# Patient Record
Sex: Female | Born: 1965 | Race: White | Hispanic: No | Marital: Single | Smoking: Former smoker
Health system: Southern US, Community
[De-identification: ages and names within clinical notes are randomized; demographics above are authoritative.]

## PROBLEM LIST (undated history)

## (undated) DIAGNOSIS — I499 Cardiac arrhythmia, unspecified: Secondary | ICD-10-CM

## (undated) DIAGNOSIS — K219 Gastro-esophageal reflux disease without esophagitis: Secondary | ICD-10-CM

## (undated) DIAGNOSIS — I10 Essential (primary) hypertension: Secondary | ICD-10-CM

## (undated) DIAGNOSIS — G4733 Obstructive sleep apnea (adult) (pediatric): Secondary | ICD-10-CM

## (undated) DIAGNOSIS — J302 Other seasonal allergic rhinitis: Secondary | ICD-10-CM

## (undated) DIAGNOSIS — E785 Hyperlipidemia, unspecified: Secondary | ICD-10-CM

## (undated) HISTORY — DX: Hyperlipidemia, unspecified: E78.5

## (undated) HISTORY — PX: PATELLA RECONSTRUCTION: SHX736

## (undated) HISTORY — PX: TONSILLECTOMY: SUR1361

## (undated) HISTORY — PX: DEBRIDEMENT TENNIS ELBOW: SHX1442

## (undated) HISTORY — PX: BLADDER SUSPENSION: SHX72

## (undated) HISTORY — DX: Obstructive sleep apnea (adult) (pediatric): G47.33

## (undated) HISTORY — PX: ADENOIDECTOMY: SUR15

## (undated) HISTORY — DX: Essential (primary) hypertension: I10

## (undated) HISTORY — PX: ELBOW SURGERY: SHX618

## (undated) HISTORY — DX: Cardiac arrhythmia, unspecified: I49.9

---

## 2004-02-09 ENCOUNTER — Ambulatory Visit: Payer: Self-pay | Admitting: Internal Medicine

## 2004-03-22 ENCOUNTER — Ambulatory Visit: Payer: Self-pay | Admitting: Internal Medicine

## 2004-10-23 ENCOUNTER — Ambulatory Visit: Payer: Self-pay | Admitting: Internal Medicine

## 2005-09-06 ENCOUNTER — Ambulatory Visit: Payer: Self-pay | Admitting: Internal Medicine

## 2006-08-15 ENCOUNTER — Telehealth: Payer: Self-pay | Admitting: Internal Medicine

## 2006-08-15 ENCOUNTER — Ambulatory Visit: Payer: Self-pay | Admitting: Internal Medicine

## 2006-08-15 DIAGNOSIS — J309 Allergic rhinitis, unspecified: Secondary | ICD-10-CM | POA: Insufficient documentation

## 2006-08-15 DIAGNOSIS — I10 Essential (primary) hypertension: Secondary | ICD-10-CM

## 2006-08-15 LAB — CONVERTED CEMR LAB
ALT: 17 units/L (ref 0–35)
Albumin: 4 g/dL (ref 3.5–5.2)
Basophils Absolute: 0.1 10*3/uL (ref 0.0–0.1)
Bilirubin Urine: NEGATIVE
CO2: 27 meq/L (ref 19–32)
Cholesterol: 186 mg/dL (ref 0–200)
Creatinine, Ser: 0.5 mg/dL (ref 0.4–1.2)
Eosinophils Absolute: 0.4 10*3/uL (ref 0.0–0.6)
GFR calc non Af Amer: 144 mL/min
Glucose, Urine, Semiquant: NEGATIVE
HCT: 39.2 % (ref 36.0–46.0)
HDL: 29.7 mg/dL — ABNORMAL LOW (ref >39.0)
LDL Cholesterol: 139 mg/dL — ABNORMAL HIGH (ref 0–99)
MCHC: 34.8 g/dL (ref 30.0–36.0)
MCV: 90 fL (ref 78.0–100.0)
Monocytes Relative: 7.4 % (ref 3.0–11.0)
Neutrophils Relative %: 62 % (ref 43.0–77.0)
Platelets: 274 10*3/uL (ref 150–400)
Potassium: 4 meq/L (ref 3.5–5.1)
RDW: 12.8 % (ref 11.5–14.6)
Sodium: 137 meq/L (ref 135–145)
Specific Gravity, Urine: 1.02
TSH: 3.13 microintl units/mL (ref 0.35–5.50)
Total CHOL/HDL Ratio: 6.3

## 2006-08-21 ENCOUNTER — Ambulatory Visit: Payer: Self-pay | Admitting: Internal Medicine

## 2006-08-21 DIAGNOSIS — K219 Gastro-esophageal reflux disease without esophagitis: Secondary | ICD-10-CM | POA: Insufficient documentation

## 2006-09-13 ENCOUNTER — Telehealth: Payer: Self-pay | Admitting: *Deleted

## 2007-02-28 ENCOUNTER — Ambulatory Visit: Payer: Self-pay | Admitting: Internal Medicine

## 2007-02-28 DIAGNOSIS — M549 Dorsalgia, unspecified: Secondary | ICD-10-CM | POA: Insufficient documentation

## 2007-09-24 ENCOUNTER — Ambulatory Visit: Payer: Self-pay | Admitting: Internal Medicine

## 2007-12-22 ENCOUNTER — Encounter: Admission: RE | Admit: 2007-12-22 | Discharge: 2007-12-22 | Payer: Self-pay | Admitting: Obstetrics and Gynecology

## 2008-03-24 ENCOUNTER — Ambulatory Visit: Payer: Self-pay | Admitting: Internal Medicine

## 2008-08-25 ENCOUNTER — Ambulatory Visit: Payer: Self-pay | Admitting: Internal Medicine

## 2008-08-25 ENCOUNTER — Telehealth: Payer: Self-pay | Admitting: Internal Medicine

## 2008-09-14 ENCOUNTER — Ambulatory Visit: Payer: Self-pay | Admitting: Internal Medicine

## 2008-10-28 ENCOUNTER — Telehealth: Payer: Self-pay | Admitting: Internal Medicine

## 2009-04-28 ENCOUNTER — Encounter: Payer: Self-pay | Admitting: Internal Medicine

## 2009-05-16 ENCOUNTER — Ambulatory Visit: Payer: Self-pay | Admitting: Occupational Medicine

## 2009-05-16 ENCOUNTER — Encounter: Admission: RE | Admit: 2009-05-16 | Discharge: 2009-05-16 | Payer: Self-pay | Admitting: Obstetrics and Gynecology

## 2009-05-26 ENCOUNTER — Encounter: Payer: Self-pay | Admitting: Internal Medicine

## 2009-05-26 DIAGNOSIS — I499 Cardiac arrhythmia, unspecified: Secondary | ICD-10-CM

## 2009-05-26 HISTORY — DX: Cardiac arrhythmia, unspecified: I49.9

## 2009-06-03 ENCOUNTER — Encounter: Payer: Self-pay | Admitting: Internal Medicine

## 2009-06-08 ENCOUNTER — Encounter: Payer: Self-pay | Admitting: Internal Medicine

## 2009-07-01 ENCOUNTER — Encounter: Payer: Self-pay | Admitting: Internal Medicine

## 2009-09-26 ENCOUNTER — Telehealth: Payer: Self-pay | Admitting: Internal Medicine

## 2009-11-24 ENCOUNTER — Encounter: Payer: Self-pay | Admitting: Internal Medicine

## 2010-01-29 ENCOUNTER — Encounter: Payer: Self-pay | Admitting: Internal Medicine

## 2010-02-07 NOTE — Letter (Signed)
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  Please take this into consideration when reviewing the time away from work/school.    Special Instructions:  [  ] None  [  X] To be off the remainder of today, returning to the normal work / school schedule tomorrow.  [  ] To be off until the next scheduled appointment on ______________________.  [  ] Other ________________________________________________________________ ________________________________________________________________________   Sincerely yours,   Lucia Gaskins MD

## 2010-02-07 NOTE — Letter (Signed)
Summary: [facility]  [facility]   Imported By: Maryln Gottron 06/21/2009 12:43:23  _____________________________________________________________________  External Attachment:    Type:   Image     Comment:   External Document

## 2010-02-07 NOTE — Letter (Signed)
Summary: [facility]  [facility]   Imported By: Maryln Gottron 05/09/2009 13:05:03  _____________________________________________________________________  External Attachment:    Type:   Image     Comment:   External Document
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
## 2010-02-07 NOTE — Letter (Signed)
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  Please take this into consideration when reviewing the time away from work/school.    Special Instructions:  [  ] None  [  ] To be off the remainder of today, returning to the normal work / school schedule tomorrow.  [  ] To be off until the next scheduled appointment on ______________________.  [  ] Other ________________________________________________________________ ________________________________________________________________________   Sincerely yours,   Lucia Gaskins MD

## 2010-02-07 NOTE — Progress Notes (Signed)
Summary: refill pain med - denied  Phone Note Refill Request Message from:  Fax from Pharmacy on September 26, 2009 1:43 PM  Refills Requested: Medication #1:  HYDROCODONE-ACETAMINOPHEN 5-500 MG  TABS one every 6 hours for pain   Last Refilled: 06/04/2009 [facility] [address]    Method Requested: Fax to Local Pharmacy Initial call taken by: Duard Brady LPN,  September 26, 2009 1:44 PM  Follow-up for Phone Call        denied - faxed to [facility] - last seen 09/2008 - needs to be seen . KIK Follow-up by: Duard Brady LPN,  September 26, 2009 1:45 PM

## 2010-02-07 NOTE — Letter (Signed)
Summary: [facility]  [facility]   Imported By: Maryln Gottron 07/14/2009 13:53:38  _____________________________________________________________________  External Attachment:    Type:   Image     Comment:   External Document

## 2010-02-07 NOTE — Letter (Signed)
Summary: [facility]   Imported By: Maryln Gottron 08/09/2009 09:58:01  _____________________________________________________________________  External Attachment:    Type:   Image     Comment:   External Document

## 2010-02-09 NOTE — Letter (Signed)
Summary: [facility]  [facility]   Imported By: Maryln Gottron 01/05/2010 15:01:07  _____________________________________________________________________  External Attachment:    Type:   Image     Comment:   External Document

## 2010-03-02 ENCOUNTER — Inpatient Hospital Stay (INDEPENDENT_AMBULATORY_CARE_PROVIDER_SITE_OTHER)
Admission: RE | Admit: 2010-03-02 | Discharge: 2010-03-02 | Disposition: A | Discharge status: Home / Self Care | Payer: BC Managed Care – PPO | Source: Ambulatory Visit | Attending: Family Medicine | Admitting: Family Medicine

## 2010-03-02 DIAGNOSIS — J019 Acute sinusitis, unspecified: Secondary | ICD-10-CM

## 2011-11-20 ENCOUNTER — Other Ambulatory Visit: Payer: Self-pay | Admitting: Obstetrics and Gynecology

## 2011-11-20 DIAGNOSIS — Z1231 Encounter for screening mammogram for malignant neoplasm of breast: Secondary | ICD-10-CM

## 2011-12-11 ENCOUNTER — Ambulatory Visit (INDEPENDENT_AMBULATORY_CARE_PROVIDER_SITE_OTHER): Payer: BC Managed Care – PPO

## 2011-12-11 DIAGNOSIS — Z1231 Encounter for screening mammogram for malignant neoplasm of breast: Secondary | ICD-10-CM

## 2012-02-11 ENCOUNTER — Encounter: Payer: Self-pay | Admitting: *Deleted

## 2012-02-11 NOTE — Telephone Encounter (Signed)
Opened in error

## 2012-05-06 ENCOUNTER — Ambulatory Visit (INDEPENDENT_AMBULATORY_CARE_PROVIDER_SITE_OTHER): Payer: BC Managed Care – PPO | Admitting: Internal Medicine

## 2012-05-06 ENCOUNTER — Encounter: Payer: Self-pay | Admitting: Internal Medicine

## 2012-05-06 VITALS — BP 180/100 | HR 116 | Temp 97.8°F | Resp 20 | Ht 66.0 in | Wt 215.0 lb

## 2012-05-06 DIAGNOSIS — K219 Gastro-esophageal reflux disease without esophagitis: Secondary | ICD-10-CM

## 2012-05-06 DIAGNOSIS — Z0000 Encounter for general adult medical examination without abnormal findings: Secondary | ICD-10-CM

## 2012-05-06 DIAGNOSIS — M549 Dorsalgia, unspecified: Secondary | ICD-10-CM

## 2012-05-06 DIAGNOSIS — I10 Essential (primary) hypertension: Secondary | ICD-10-CM

## 2012-05-06 DIAGNOSIS — J309 Allergic rhinitis, unspecified: Secondary | ICD-10-CM

## 2012-05-06 MED ORDER — BENAZEPRIL-HYDROCHLOROTHIAZIDE 20-12.5 MG PO TABS: 1.0000 | ORAL_TABLET | Freq: Every day | ORAL | Status: DC

## 2012-05-06 MED ORDER — PANTOPRAZOLE SODIUM 40 MG PO TBEC: 40.0000 mg | DELAYED_RELEASE_TABLET | Freq: Every day | ORAL | Status: DC

## 2012-05-06 NOTE — Patient Instructions (Signed)

## 2012-05-06 NOTE — Progress Notes (Signed)
Subjective:    Patient ID: Jessica Glass, female    DOB: 1965-10-12, 47 y.o.   MRN: [id number]  HPI  47 year old patient who is seen today to reestablish with our practice. She has not been seen in this office in a number of years. She has long history of treated hypertension which has been controlled on combination therapy. Medical problems include gastroesophageal reflux disease allergic rhinitis. She has a history of migraine headaches low back pain and urinary incontinence. Surgical procedures have included a remote tonsillectomy bladder sling procedure in 2014 and knee surgery in 1983.  Patient has been followed by [facility] and has had recent laboratory studies including a lipid panel. He is scheduled to see Dr. Allyson Sabal in the near future. She has a history of exercise associated tachycardia  Family history father age 21 history of hypertension status post cholecystectomy Mother died age 44 of ovarian cancer history of hypertension and diabetes 3 sisters posture hypertension and dyslipidemia One brother is well Tobacco use  History reviewed. No pertinent past medical history.  History   Social History  . Marital Status: Single    Spouse Name: N/A    Number of Children: N/A  . Years of Education: N/A   Occupational History  . Not on file.   Social History Main Topics  . Smoking status: Former Games developer  . Smokeless tobacco: Never Used  . Alcohol Use: No  . Drug Use: No  . Sexually Active: Not on file   Other Topics Concern  . Not on file   Social History Narrative  . No narrative on file    History reviewed. No pertinent past surgical history.  No family history on file.  No Known Allergies  No current outpatient prescriptions on file prior to visit.   No current facility-administered medications on file prior to visit.    BP 180/100  Pulse 116  Temp(Src) 97.8 F (36.6 C) (Oral)  Resp 20  Ht 5\' 6"  (1.676 m)  Wt 215 lb (97.523 kg)  BMI  34.72 kg/m2  SpO2 97%  LMP 04/21/2012      Review of Systems  Constitutional: Negative for fever, appetite change, fatigue and unexpected weight change.  HENT: Negative for hearing loss, ear pain, nosebleeds, congestion, sore throat, mouth sores, trouble swallowing, neck stiffness, dental problem, voice change, sinus pressure and tinnitus.   Eyes: Negative for photophobia, pain, redness and visual disturbance.  Respiratory: Negative for cough, chest tightness and shortness of breath.   Cardiovascular: Negative for chest pain, palpitations and leg swelling.  Gastrointestinal: Negative for nausea, vomiting, abdominal pain, diarrhea, constipation, blood in stool, abdominal distention and rectal pain.  Genitourinary: Negative for dysuria, urgency, frequency, hematuria, flank pain, vaginal bleeding, vaginal discharge, difficulty urinating, genital sores, vaginal pain, menstrual problem and pelvic pain.  Musculoskeletal: Negative for back pain and arthralgias.       Bilateral knee pain Status post bilateral joint injections today by orthopedics  Skin: Negative for rash.  Neurological: Negative for dizziness, syncope, speech difficulty, weakness, light-headedness, numbness and headaches.  Hematological: Negative for adenopathy. Does not bruise/bleed easily.  Psychiatric/Behavioral: Negative for suicidal ideas, behavioral problems, self-injury, dysphoric mood and agitation. The patient is not nervous/anxious.        Objective:   Physical Exam  Constitutional: She is oriented to person, place, and time. She appears well-developed and well-nourished.  Repeat blood pressure 120/80  HENT:  Head: Normocephalic and atraumatic.  Right Ear: External ear normal.  Left Ear:  External ear normal.  Mouth/Throat: Oropharynx is clear and moist.  Eyes: Conjunctivae and EOM are normal.  Neck: Normal range of motion. Neck supple. No JVD present. No thyromegaly present.  Cardiovascular: Normal rate,  regular rhythm, normal heart sounds and intact distal pulses.   No murmur heard. Pull shirt posture 100  Pulmonary/Chest: Effort normal and breath sounds normal. She has no wheezes. She has no rales.  Abdominal: Soft. Bowel sounds are normal. She exhibits no distension and no mass. There is no tenderness. There is no rebound and no guarding.  Genitourinary: Vagina normal.  Musculoskeletal: Normal range of motion. She exhibits no edema and no tenderness.  Neurological: She is alert and oriented to person, place, and time. She has normal reflexes. No cranial nerve deficit. She exhibits normal muscle tone. Coordination normal.  Skin: Skin is warm and dry. No rash noted.  Psychiatric: She has a normal mood and affect. Her behavior is normal.          Assessment & Plan:  Preventive health examination Hypertension Exogenous obesity  Weight loss encouraged We'll continue low-salt diet Exercise regimen recommended Recheck 6 months

## 2012-06-04 ENCOUNTER — Encounter: Payer: Self-pay | Admitting: Cardiovascular Disease

## 2012-06-04 ENCOUNTER — Encounter: Payer: Self-pay | Admitting: *Deleted

## 2012-06-05 ENCOUNTER — Ambulatory Visit (INDEPENDENT_AMBULATORY_CARE_PROVIDER_SITE_OTHER): Payer: BC Managed Care – PPO | Admitting: Cardiovascular Disease

## 2012-06-05 ENCOUNTER — Encounter: Payer: Self-pay | Admitting: Cardiovascular Disease

## 2012-06-05 VITALS — BP 132/92 | HR 82 | Ht 64.75 in | Wt 209.0 lb

## 2012-06-05 DIAGNOSIS — I10 Essential (primary) hypertension: Secondary | ICD-10-CM

## 2012-06-05 DIAGNOSIS — Z79899 Other long term (current) drug therapy: Secondary | ICD-10-CM

## 2012-06-05 DIAGNOSIS — E785 Hyperlipidemia, unspecified: Secondary | ICD-10-CM

## 2012-06-05 DIAGNOSIS — G4733 Obstructive sleep apnea (adult) (pediatric): Secondary | ICD-10-CM

## 2012-06-05 MED ORDER — ATORVASTATIN CALCIUM 20 MG PO TABS: 20.0000 mg | ORAL_TABLET | Freq: Every day | ORAL | Status: DC

## 2012-06-05 NOTE — Patient Instructions (Addendum)
  Your physician wants you to follow-up with him in : 12 months                                                       You will receive a reminder letter in the mail one month in advance. If you don't receive a letter, please call our office to schedule the follow-up appointment.   Your physician recommends that you return for lab work in: 6-8 weeks, fasting   Your physician has recommended you make the following change in your medication: start atorvastatin 20mg  daily for your cholesterol   Dr Allyson Sabal would like for you to get a sleep study at [facility]

## 2012-06-05 NOTE — Assessment & Plan Note (Signed)
Recent lipid profile revealed total cholesterol 205, LDL 139 and HDL of 35. And to begin her on a burst and 20 mg a day and recheck a lipid and liver profile in 2 months. She does admit to dietary indiscretion.

## 2012-06-05 NOTE — Assessment & Plan Note (Addendum)
Borderline compensated on medication

## 2012-06-05 NOTE — Progress Notes (Signed)
06/05/2012 Jessica Glass   [birth year]  [id number]  Primary Physician Rogelia Boga, MD Primary Cardiologist: Runell Gess MD Jessica Glass    HPI:  Jessica Glass is a 47 year old moderately overweight single Caucasian female the children while I saw 11/24/09. She works as a Hospital doctor. Her factors include remote tobacco abuse, hyperlipidemia and hypertension. She denies chest pain or shortness of breath. She did see me for tachypalpitations. A 2-D echo was normal as were thyroid function tests. Her symptoms are somewhat improved over last several years. She also complains of symptoms compatible type of sleep apnea. Recent lab work revealed a total cholesterol 205, LDL of 149 and HDL of 35.   Current Outpatient Prescriptions  Medication Sig Dispense Refill  . benazepril-hydrochlorthiazide (LOTENSIN HCT) 20-12.5 MG per tablet Take 1 tablet by mouth daily.  90 tablet  6  . cetirizine (ZYRTEC) 10 MG tablet Take 10 mg by mouth daily.      . chlorhexidine (PERIDEX) 0.12 % solution Use as directed 15 mLs in the mouth or throat 2 (two) times daily.       . DRYSOL 20 % external solution Apply 1 application topically every other day.      Marland Kitchen EPIPEN 2-PAK 0.3 MG/0.3ML SOAJ Inject 0.3 mg into the skin as needed.      . nitrofurantoin, macrocrystal-monohydrate, (MACROBID) 100 MG capsule Take 2 capsules by mouth every 12 (twelve) hours.      . pantoprazole (PROTONIX) 40 MG tablet Take 1 tablet (40 mg total) by mouth daily.  90 tablet  6  . atorvastatin (LIPITOR) 20 MG tablet Take 1 tablet (20 mg total) by mouth daily.  30 tablet  11   No current facility-administered medications for this visit.    No Known Allergies  History   Social History  . Marital Status: Single    Spouse Name: N/A    Number of Children: N/A  . Years of Education: N/A   Occupational History  . Not on file.   Social History Main Topics  . Smoking status: Former Smoker -- 0.25 packs/day    Types:  Cigarettes    Quit date: 06/06/2007  . Smokeless tobacco: Never Used  . Alcohol Use: No  . Drug Use: No  . Sexually Active: Not on file   Other Topics Concern  . Not on file   Social History Narrative  . No narrative on file     Review of Systems: General: negative for chills, fever, night sweats or weight changes.  Cardiovascular: negative for chest pain, dyspnea on exertion, edema, orthopnea, palpitations, paroxysmal nocturnal dyspnea or shortness of breath Dermatological: negative for rash Respiratory: negative for cough or wheezing Urologic: negative for hematuria Abdominal: negative for nausea, vomiting, diarrhea, bright red blood per rectum, melena, or hematemesis Neurologic: negative for visual changes, syncope, or dizziness All other systems reviewed and are otherwise negative except as noted above.    Blood pressure 132/92, pulse 82, height 5' 4.75" (1.645 m), weight 209 lb (94.802 kg), last menstrual period 04/21/2012.  General appearance: alert and no distress Neck: no adenopathy, no carotid bruit, no JVD, supple, symmetrical, trachea midline and thyroid not enlarged, symmetric, no tenderness/mass/nodules Lungs: clear to auscultation bilaterally Heart: regular rate and rhythm, S1, S2 normal, no murmur, click, rub or gallop Extremities: extremities normal, atraumatic, no cyanosis or edema  EKG sinus rhythm 82 without ST or T wave changes  ASSESSMENT AND PLAN:   Hypertension Borderline compensated on medication  Hyperlipidemia Recent lipid profile  revealed total cholesterol 205, LDL 139 and HDL of 35. And to begin her on a burst and 20 mg a day and recheck a lipid and liver profile in 2 months. She does admit to dietary indiscretion.      Runell Gess MD FACP,FACC,FAHA, [facility] 06/05/2012 9:03 AM

## 2012-06-06 ENCOUNTER — Telehealth: Payer: Self-pay | Admitting: Cardiovascular Disease

## 2012-06-06 MED ORDER — ATORVASTATIN CALCIUM 20 MG PO TABS: 20.0000 mg | ORAL_TABLET | Freq: Every day | ORAL | Status: DC

## 2012-06-06 NOTE — Telephone Encounter (Signed)
Saw Dr Allyson Sabal yesterday here at the office-said he was going to call in her cholesterol medicine-checked with the pharmacist a few minutes ago! Pleas call in to [facility] in [place]

## 2012-06-06 NOTE — Telephone Encounter (Signed)
rx sent to [facility]

## 2012-06-18 ENCOUNTER — Telehealth: Payer: Self-pay | Admitting: Cardiovascular Disease

## 2012-06-18 NOTE — Telephone Encounter (Signed)
Returned call.  Left message to call back before 4pm.  

## 2012-06-18 NOTE — Telephone Encounter (Signed)
Returned call.  Left message to call back tomorrow before 4pm.  

## 2012-06-18 NOTE — Telephone Encounter (Signed)
Returning Amber call

## 2012-06-18 NOTE — Telephone Encounter (Signed)
Wants to know who Dr Allyson Sabal referred her to for sleep study?

## 2012-06-19 ENCOUNTER — Telehealth: Payer: Self-pay | Admitting: *Deleted

## 2012-06-19 NOTE — Telephone Encounter (Signed)
Pt stated she was supposed to be scheduled for a sleep study at the center in [facility].  Stated she called both centers in [facility] and was told they don't have anything for her.  Stated she has to go to [employer] b/c of her job and cannot go to [place].  Stated she know she was given an appt card and thinks the appt was on the 17th (June), but she can' t find the card.  Pt would like the referral, insurance info and records sent to [facility] in [facility] [phone] (phone).  Pt informed Neill Loft, RN is out of the office the rest of the week and another nurse/CMA will be notified.  Pt  Pt verbalized understanding and agreed w/ plan.  Pt may have an appt w/ [facility] and it will need to be cancelled.  Pt would like a call back on her cell and stated it is okay to leave a message on her cell if she doesn't answer.  Paper chart requested.

## 2012-07-07 ENCOUNTER — Encounter: Payer: Self-pay | Admitting: Internal Medicine

## 2012-07-28 ENCOUNTER — Other Ambulatory Visit: Payer: Self-pay | Admitting: *Deleted

## 2012-07-30 ENCOUNTER — Other Ambulatory Visit: Payer: Self-pay | Admitting: *Deleted

## 2012-07-30 MED ORDER — ATORVASTATIN CALCIUM 20 MG PO TABS: 20.0000 mg | ORAL_TABLET | Freq: Every day | ORAL | Status: DC

## 2012-08-04 ENCOUNTER — Telehealth: Payer: Self-pay | Admitting: Cardiovascular Disease

## 2012-08-04 NOTE — Telephone Encounter (Signed)
Returned call.  Left message to call back tomorrow before 4pm.  

## 2012-08-04 NOTE — Telephone Encounter (Signed)
Patient returned your call.

## 2012-08-04 NOTE — Telephone Encounter (Signed)
Waiting for sleep study results and wants to know what is the next step?Jessica Glass is out because of her mother's death-that is why I am sending it to the clinical pool

## 2012-08-05 NOTE — Telephone Encounter (Signed)
Returned call.  Left message to call back before 4pm and she will be contacted once results reviewed with next steps.

## 2012-11-05 ENCOUNTER — Ambulatory Visit (INDEPENDENT_AMBULATORY_CARE_PROVIDER_SITE_OTHER): Payer: BC Managed Care – PPO | Admitting: Internal Medicine

## 2012-11-05 ENCOUNTER — Encounter: Payer: Self-pay | Admitting: Internal Medicine

## 2012-11-05 VITALS — BP 140/90 | HR 89 | Temp 98.1°F | Resp 20 | Wt 206.0 lb

## 2012-11-05 DIAGNOSIS — I10 Essential (primary) hypertension: Secondary | ICD-10-CM

## 2012-11-05 DIAGNOSIS — E785 Hyperlipidemia, unspecified: Secondary | ICD-10-CM

## 2012-11-05 NOTE — Patient Instructions (Signed)
Limit your sodium (Salt) intake    It is important that you exercise regularly, at least 20 minutes 3 to 4 times per week.  If you develop chest pain or shortness of breath seek  medical attention.  You need to lose weight.  Consider a lower calorie diet and regular exercise. 

## 2012-11-05 NOTE — Progress Notes (Signed)
Subjective:    Patient ID: Jessica Glass, female    DOB: 02-23-1965, 47 y.o.   MRN: [id number]  HPI  47 year old patient who is in today for followup. She has hypertension obesity. She was seen by cardiology 6 months ago and now is on low intensity statin therapy. She is doing quite well without concerns or complaints. She has been compliant with her medications.  Past Medical History  Diagnosis Date  . Arrhythmia 05/26/09    ECHO ALL CHAMBERS  ARE NORMAL IN SIZE AND FUNCTION EF70%  . Hypertension   . Hyperlipidemia     History   Social History  . Marital Status: Single    Spouse Name: N/A    Number of Children: N/A  . Years of Education: N/A   Occupational History  . Not on file.   Social History Main Topics  . Smoking status: Former Smoker -- 0.25 packs/day    Types: Cigarettes    Quit date: 06/06/2007  . Smokeless tobacco: Never Used  . Alcohol Use: No  . Drug Use: No  . Sexual Activity: Not on file   Other Topics Concern  . Not on file   Social History Narrative  . No narrative on file    History reviewed. No pertinent past surgical history.  No family history on file.  No Known Allergies  Current Outpatient Prescriptions on File Prior to Visit  Medication Sig Dispense Refill  . atorvastatin (LIPITOR) 20 MG tablet Take 1 tablet (20 mg total) by mouth daily.  90 tablet  2  . benazepril-hydrochlorthiazide (LOTENSIN HCT) 20-12.5 MG per tablet Take 1 tablet by mouth daily.  90 tablet  6  . cetirizine (ZYRTEC) 10 MG tablet Take 10 mg by mouth daily.      . chlorhexidine (PERIDEX) 0.12 % solution Use as directed 15 mLs in the mouth or throat 2 (two) times daily.       . DRYSOL 20 % external solution Apply 1 application topically every other day.      Marland Kitchen EPIPEN 2-PAK 0.3 MG/0.3ML SOAJ Inject 0.3 mg into the skin as needed.      . pantoprazole (PROTONIX) 40 MG tablet Take 1 tablet (40 mg total) by mouth daily.  90 tablet  6   No current facility-administered  medications on file prior to visit.    BP 140/90  Pulse 89  Temp(Src) 98.1 F (36.7 C) (Oral)  Resp 20  Wt 206 lb (93.441 kg)  BMI 34.53 kg/m2  SpO2 97%       Review of Systems  Constitutional: Negative.   HENT: Negative for congestion, dental problem, hearing loss, rhinorrhea, sinus pressure, sore throat and tinnitus.   Eyes: Negative for pain, discharge and visual disturbance.  Respiratory: Negative for cough and shortness of breath.   Cardiovascular: Negative for chest pain, palpitations and leg swelling.  Gastrointestinal: Negative for nausea, vomiting, abdominal pain, diarrhea, constipation, blood in stool and abdominal distention.  Genitourinary: Negative for dysuria, urgency, frequency, hematuria, flank pain, vaginal bleeding, vaginal discharge, difficulty urinating, vaginal pain and pelvic pain.  Musculoskeletal: Negative for arthralgias, gait problem and joint swelling.  Skin: Negative for rash.  Neurological: Negative for dizziness, syncope, speech difficulty, weakness, numbness and headaches.  Hematological: Negative for adenopathy.  Psychiatric/Behavioral: Negative for behavioral problems, dysphoric mood and agitation. The patient is not nervous/anxious.        Objective:   Physical Exam  Constitutional: She is oriented to person, place, and time. She appears well-developed and  well-nourished.  Repeat blood pressure 120/84  HENT:  Head: Normocephalic.  Right Ear: External ear normal.  Left Ear: External ear normal.  Mouth/Throat: Oropharynx is clear and moist.  Eyes: Conjunctivae and EOM are normal. Pupils are equal, round, and reactive to light.  Neck: Normal range of motion. Neck supple. No thyromegaly present.  Cardiovascular: Normal rate, regular rhythm, normal heart sounds and intact distal pulses.   Pulmonary/Chest: Effort normal and breath sounds normal.  Abdominal: Soft. Bowel sounds are normal. She exhibits no mass. There is no tenderness.   Musculoskeletal: Normal range of motion.  Lymphadenopathy:    She has no cervical adenopathy.  Neurological: She is alert and oriented to person, place, and time.  Skin: Skin is warm and dry. No rash noted.  Psychiatric: She has a normal mood and affect. Her behavior is normal.          Assessment & Plan:   Hypertension well controlled Exogenous obesity. Weight loss exercise encouraged

## 2012-12-29 ENCOUNTER — Telehealth: Payer: Self-pay | Admitting: Cardiovascular Disease

## 2012-12-29 NOTE — Telephone Encounter (Signed)
Patient returned my call regarding scheduling sleep study.  Patient states that she has had her study and is on a CPAP machine.

## 2012-12-30 ENCOUNTER — Other Ambulatory Visit: Payer: Self-pay

## 2012-12-30 DIAGNOSIS — Z1231 Encounter for screening mammogram for malignant neoplasm of breast: Secondary | ICD-10-CM

## 2013-01-06 ENCOUNTER — Ambulatory Visit: Payer: BC Managed Care – PPO

## 2013-01-23 ENCOUNTER — Emergency Department (INDEPENDENT_AMBULATORY_CARE_PROVIDER_SITE_OTHER)
Admission: EM | Admit: 2013-01-23 | Discharge: 2013-01-23 | Disposition: A | Discharge status: Home / Self Care | Payer: BC Managed Care – PPO | Source: Home / Self Care | Attending: Emergency Medicine | Admitting: Emergency Medicine

## 2013-01-23 ENCOUNTER — Encounter: Payer: Self-pay | Admitting: Emergency Medicine

## 2013-01-23 DIAGNOSIS — H66009 Acute suppurative otitis media without spontaneous rupture of ear drum, unspecified ear: Secondary | ICD-10-CM

## 2013-01-23 DIAGNOSIS — J0100 Acute maxillary sinusitis, unspecified: Secondary | ICD-10-CM

## 2013-01-23 DIAGNOSIS — H66001 Acute suppurative otitis media without spontaneous rupture of ear drum, right ear: Secondary | ICD-10-CM

## 2013-01-23 HISTORY — DX: Gastro-esophageal reflux disease without esophagitis: K21.9

## 2013-01-23 HISTORY — DX: Other seasonal allergic rhinitis: J30.2

## 2013-01-23 MED ORDER — MOMETASONE FUROATE 50 MCG/ACT NA SUSP: NASAL | Status: DC

## 2013-01-23 MED ORDER — LEVOFLOXACIN 500 MG PO TABS: ORAL_TABLET | ORAL | Status: DC

## 2013-01-23 NOTE — ED Notes (Addendum)
Pt c/o decreased hearing in her RT ear x 1wk, post sinus infection on 01/02/13. Denies pain or fever.

## 2013-01-23 NOTE — ED Provider Notes (Signed)
CSN: [id number]     Arrival date & time 01/23/13  1500 History   [id number] MD Initiated Contact with Patient 01/23/13 1516     Chief Complaint  Patient presents with  . Ear Problem   (Consider location/radiation/quality/duration/timing/severity/associated sxs/prior Treatment) HPI URI HISTORY  Jessica Glass is a 48 y.o. female who complains of onset of cold/sinus symptoms for 21 days. [facility] at the end of December with cephalexin, and that helped the symptoms somewhat, but still has colored rhinorrhea and sinus congestion. Worsening right ear pressure and discomfort and decreased hearing right ear for one week  No chills/sweats +  Fever  +  Nasal congestion +  Discolored Post-nasal drainage Positive sinus pain/pressure No sore throat  No  cough No wheezing No chest congestion No hemoptysis No shortness of breath No pleuritic pain  No itchy/red eyes Positive for right earache  No nausea No vomiting No abdominal pain No diarrhea  No skin rashes +  Fatigue No myalgias No headache   Past Medical History  Diagnosis Date  . Arrhythmia 05/26/09    ECHO ALL CHAMBERS  ARE NORMAL IN SIZE AND FUNCTION EF70%  . Hypertension   . Hyperlipidemia   . GERD (gastroesophageal reflux disease)   . Seasonal allergies    Past Surgical History  Procedure Laterality Date  . Tonsillectomy    . Adenoidectomy    . Debridement tennis elbow    . Patella reconstruction Left   . Bladder suspension     Family History  Problem Relation Age of Onset  . Cancer Mother     ovarian  . Hypertension Mother   . Diabetes Mother   . Heart disease Mother   . Hyperlipidemia Father   . Hypertension Father   . Cancer Maternal Aunt     breast   History  Substance Use Topics  . Smoking status: Former Smoker -- 0.25 packs/day    Types: Cigarettes    Quit date: 06/06/2007  . Smokeless tobacco: Never Used  . Alcohol Use: No   OB History   Grav Para Term Preterm Abortions  TAB SAB Ect Mult Living                 Review of Systems  All other systems reviewed and are negative.    Allergies  Review of patient's allergies indicates no known allergies.  Home Medications   Current Outpatient Rx  Name  Route  Sig  Dispense  Refill  . Ascorbic Acid (VITAMIN C) 1000 MG tablet   Oral   Take 1,000 mg by mouth daily.         Marland Kitchen. atorvastatin (LIPITOR) 20 MG tablet   Oral   Take 1 tablet (20 mg total) by mouth daily.   90 tablet   2   . benazepril-hydrochlorthiazide (LOTENSIN HCT) 20-12.5 MG per tablet   Oral   Take 1 tablet by mouth daily.   90 tablet   6   . chlorhexidine (PERIDEX) 0.12 % solution   Mouth/Throat   Use as directed 15 mLs in the mouth or throat 2 (two) times daily.          . DRYSOL 20 % external solution   Topical   Apply 1 application topically every other day.         Marland Kitchen. EPIPEN 2-PAK 0.3 MG/0.3ML SOAJ   Subcutaneous   Inject 0.3 mg into the skin as needed.         . Ginkgo  Biloba 40 MG TABS   Oral   Take 120 mg by mouth daily.         [name] (GLUCOSAMINE MSM COMPLEX PO)   Oral   Take 1,500 mg by mouth 2 (two) times daily.         Marland Kitchen levofloxacin (LEVAQUIN) 500 MG tablet      Take 1 tablet daily X 10 days.   10 tablet   0   . mometasone (NASONEX) 50 MCG/ACT nasal spray      1 or 2 sprays in each nostril twice daily   17 g   12   . niacin (SLO-NIACIN) 500 MG tablet   Oral   Take 500 mg by mouth every other day.         . pantoprazole (PROTONIX) 40 MG tablet   Oral   Take 1 tablet (40 mg total) by mouth daily.   90 tablet   6    BP 130/86  Pulse 115  Temp(Src) 98.1 F (36.7 C) (Oral)  Resp 18  Wt 215 lb (97.523 kg)  SpO2 97%  LMP 01/02/2013 Physical Exam  Nursing note and vitals reviewed. Constitutional: She is oriented to person, place, and time. She appears well-developed and well-nourished. No distress.  HENT:  Head: Normocephalic and atraumatic.  Right  Ear: External ear and ear canal normal. Tympanic membrane is erythematous. Tympanic membrane is not perforated. A middle ear effusion is present. Decreased hearing is noted.  Left Ear: Hearing, tympanic membrane, external ear and ear canal normal.  Nose: Mucosal edema and rhinorrhea present. Right sinus exhibits maxillary sinus tenderness. Left sinus exhibits maxillary sinus tenderness.  Mouth/Throat: Oropharynx is clear and moist. No oral lesions. No oropharyngeal exudate.  Eyes: Right eye exhibits no discharge. Left eye exhibits no discharge. No scleral icterus.  Neck: Neck supple.  Cardiovascular: Normal rate, regular rhythm and normal heart sounds.   Pulmonary/Chest: Effort normal and breath sounds normal. She has no wheezes. She has no rales.  Lymphadenopathy:    She has no cervical adenopathy.  Neurological: She is alert and oriented to person, place, and time.  Skin: Skin is warm and dry. No rash noted.    ED Course  Procedures (including critical care time) Labs Review Labs Reviewed - No data to display Imaging Review No results found.  EKG Interpretation    Date/Time:    Ventricular Rate:    PR Interval:    QRS Duration:   QT Interval:    QTC Calculation:   R Axis:     Text Interpretation:              MDM   1. Right acute suppurative otitis media   2. Acute maxillary sinusitis    Treatment options discussed, as well as risks, benefits, alternatives. Patient voiced understanding and agreement with the following plans: Levaquin 500 mg daily for 10 days Nasonex Other symptomatic care discussed such as Mucinex.--Avoid decongestants which can raise BP. Stop all antihistamines for a few days. Followup with PCP or ENT if not better in 7-10 days, sooner if worse or new symptoms. Precautions discussed. Red flags discussed. Questions invited and answered. Patient voiced understanding and agreement.    [name], MD 01/23/13 [id number]

## 2013-01-29 ENCOUNTER — Other Ambulatory Visit: Payer: Self-pay | Admitting: Obstetrics and Gynecology

## 2013-01-29 DIAGNOSIS — Z1231 Encounter for screening mammogram for malignant neoplasm of breast: Secondary | ICD-10-CM

## 2013-02-03 ENCOUNTER — Ambulatory Visit (INDEPENDENT_AMBULATORY_CARE_PROVIDER_SITE_OTHER): Payer: BC Managed Care – PPO

## 2013-02-03 ENCOUNTER — Encounter: Payer: Self-pay | Admitting: Emergency Medicine

## 2013-02-03 ENCOUNTER — Emergency Department (INDEPENDENT_AMBULATORY_CARE_PROVIDER_SITE_OTHER)
Admission: EM | Admit: 2013-02-03 | Discharge: 2013-02-03 | Disposition: A | Discharge status: Home / Self Care | Payer: BC Managed Care – PPO | Source: Home / Self Care | Attending: Emergency Medicine | Admitting: Emergency Medicine

## 2013-02-03 DIAGNOSIS — H6500 Acute serous otitis media, unspecified ear: Secondary | ICD-10-CM

## 2013-02-03 DIAGNOSIS — Z1231 Encounter for screening mammogram for malignant neoplasm of breast: Secondary | ICD-10-CM

## 2013-02-03 DIAGNOSIS — H6501 Acute serous otitis media, right ear: Secondary | ICD-10-CM

## 2013-02-03 DIAGNOSIS — J0100 Acute maxillary sinusitis, unspecified: Secondary | ICD-10-CM

## 2013-02-03 MED ORDER — LEVOFLOXACIN 500 MG PO TABS: ORAL_TABLET | ORAL | Status: DC

## 2013-02-03 NOTE — ED Provider Notes (Signed)
CSN: [id number]     Arrival date & time 02/03/13  1108 History   First MD Initiated Contact with Patient 02/03/13 1127     Chief Complaint  Patient presents with  . Ear Problem   (Consider location/radiation/quality/duration/timing/severity/associated sxs/prior Treatment) HPI Chief complaint is some persistent mild pressure right ear with mild sinus congestion slight colored rhinorrhea.  Was seen here by me 01/02/13 with a right acute suppurative otitis media and acute maxillary sinusitis, treated with Levaquin x10 days, Nasonex .--- She feels that her symptoms are mostly improved and she feels well without any fever or chills but still has residual , decreased hearing and mild pressure feeling in right ear, and slight yellow rhinorrhea although that's significantly improving. Past Medical History  Diagnosis Date  . Arrhythmia 05/26/09    ECHO ALL CHAMBERS  ARE NORMAL IN SIZE AND FUNCTION EF70%  . Hypertension   . Hyperlipidemia   . GERD (gastroesophageal reflux disease)   . Seasonal allergies    Past Surgical History  Procedure Laterality Date  . Tonsillectomy    . Adenoidectomy    . Debridement tennis elbow    . Patella reconstruction Left   . Bladder suspension     Family History  Problem Relation Age of Onset  . Cancer Mother     ovarian  . Hypertension Mother   . Diabetes Mother   . Heart disease Mother   . Hyperlipidemia Father   . Hypertension Father   . Cancer Maternal Aunt     breast   History  Substance Use Topics  . Smoking status: Former Smoker -- 0.25 packs/day    Types: Cigarettes    Quit date: 06/06/2007  . Smokeless tobacco: Never Used  . Alcohol Use: No   OB History   Grav Para Term Preterm Abortions TAB SAB Ect Mult Living                 Review of Systems  All other systems reviewed and are negative.    Allergies  Review of patient's allergies indicates no known allergies.  Home Medications   Current Outpatient Rx  Name  Route  Sig   Dispense  Refill  . Ascorbic Acid (VITAMIN C) 1000 MG tablet   Oral   Take 1,000 mg by mouth daily.         Marland Kitchen atorvastatin (LIPITOR) 20 MG tablet   Oral   Take 1 tablet (20 mg total) by mouth daily.   90 tablet   2   . benazepril-hydrochlorthiazide (LOTENSIN HCT) 20-12.5 MG per tablet   Oral   Take 1 tablet by mouth daily.   90 tablet   6   . chlorhexidine (PERIDEX) 0.12 % solution   Mouth/Throat   Use as directed 15 mLs in the mouth or throat 2 (two) times daily.          . DRYSOL 20 % external solution   Topical   Apply 1 application topically every other day.         Marland Kitchen EPIPEN 2-PAK 0.3 MG/0.3ML SOAJ   Subcutaneous   Inject 0.3 mg into the skin as needed.         . Ginkgo Biloba 40 MG TABS   Oral   Take 120 mg by mouth daily.         [name] (GLUCOSAMINE MSM COMPLEX PO)   Oral   Take 1,500 mg by mouth 2 (two) times daily.         Marland Kitchen  levofloxacin (LEVAQUIN) 500 MG tablet      Take 1 tablet daily X 10 days.   10 tablet   0   . levofloxacin (LEVAQUIN) 500 MG tablet      Take 1 tablet daily X 10 days.   10 tablet   0   . mometasone (NASONEX) 50 MCG/ACT nasal spray      1 or 2 sprays in each nostril twice daily   17 g   12   . niacin (SLO-NIACIN) 500 MG tablet   Oral   Take 500 mg by mouth every other day.         . pantoprazole (PROTONIX) 40 MG tablet   Oral   Take 1 tablet (40 mg total) by mouth daily.   90 tablet   6    BP 142/91  Pulse 90  Temp(Src) 98 F (36.7 C) (Oral)  Resp 18  Wt 216 lb (97.977 kg)  SpO2 96%  LMP 01/02/2013 Physical Exam  Nursing note and vitals reviewed. Constitutional: She is oriented to person, place, and time. She appears well-developed and well-nourished. No distress.  HENT:  Head: Normocephalic and atraumatic.  Right Ear: External ear and ear canal normal. Tympanic membrane is retracted. Tympanic membrane is not injected, not perforated and not erythematous. A middle ear  effusion is present.  Left Ear: Tympanic membrane, external ear and ear canal normal. Tympanic membrane is not injected, not perforated and not erythematous.  Nose: Mucosal edema and rhinorrhea present. Right sinus exhibits no maxillary sinus tenderness. Left sinus exhibits no maxillary sinus tenderness.  Mouth/Throat: Oropharynx is clear and moist. No oral lesions.  Eyes: Conjunctivae are normal. No scleral icterus.  Neck: Neck supple.  Cardiovascular: Normal rate, regular rhythm and normal heart sounds.   Pulmonary/Chest: Effort normal and breath sounds normal.  Lymphadenopathy:    She has no cervical adenopathy.  Neurological: She is alert and oriented to person, place, and time.  Skin: Skin is warm and dry.    ED Course  Procedures (including critical care time) Labs Review Labs Reviewed - No data to display Imaging Review No results found.  EKG Interpretation    Date/Time:    Ventricular Rate:    PR Interval:    QRS Duration:   QT Interval:    QTC Calculation:   R Axis:     Text Interpretation:              MDM   1. Acute serous otitis media of right ear   2. Acute maxillary sinusitis    Her acute suppurative right otitis media has resolved, but still has a residual right serous otitis media with low-grade sinusitis. Treatment options discussed, as well as risks, benefits, alternatives. Patient voiced understanding and agreement with the following plans: Continue Nasonex Zyrtec She is avoiding decongestants because of history of hypertension, and history of elevated BP with decongestants. I wrote another prescription for another 10 days of Levaquin 500 mg daily x10 days. Advised her: that I would expect her to be all better in 2 weeks and hearing in right ear to improve.--- If not improved in 2 more weeks she should see an ENT  Precautions discussed. Red flags discussed. Questions invited and answered. Patient voiced understanding and  agreement.     Lajean Manesavid Massey, MD 02/03/13 1135

## 2013-02-03 NOTE — ED Notes (Signed)
Pt c/o decreased hearing in her RT ear x 2 wks.

## 2013-03-17 ENCOUNTER — Other Ambulatory Visit: Payer: Self-pay | Admitting: Cardiovascular Disease

## 2013-03-17 NOTE — Telephone Encounter (Signed)
Rx was sent to pharmacy electronically. 

## 2013-04-10 ENCOUNTER — Ambulatory Visit: Payer: BC Managed Care – PPO | Admitting: Cardiovascular Disease

## 2013-04-10 ENCOUNTER — Ambulatory Visit (INDEPENDENT_AMBULATORY_CARE_PROVIDER_SITE_OTHER): Payer: BC Managed Care – PPO | Admitting: Cardiovascular Disease

## 2013-04-10 ENCOUNTER — Encounter: Payer: Self-pay | Admitting: Cardiovascular Disease

## 2013-04-10 VITALS — BP 122/94 | HR 92 | Ht 65.0 in | Wt 210.3 lb

## 2013-04-10 DIAGNOSIS — G4733 Obstructive sleep apnea (adult) (pediatric): Secondary | ICD-10-CM

## 2013-04-10 DIAGNOSIS — E782 Mixed hyperlipidemia: Secondary | ICD-10-CM

## 2013-04-10 DIAGNOSIS — I10 Essential (primary) hypertension: Secondary | ICD-10-CM

## 2013-04-10 DIAGNOSIS — E785 Hyperlipidemia, unspecified: Secondary | ICD-10-CM

## 2013-04-10 DIAGNOSIS — Z79899 Other long term (current) drug therapy: Secondary | ICD-10-CM

## 2013-04-10 NOTE — Assessment & Plan Note (Signed)
She did have a positive sleep study and is currently wearing and benefiting from CPAP

## 2013-04-10 NOTE — Assessment & Plan Note (Signed)
Controlled on current medications 

## 2013-04-10 NOTE — Progress Notes (Signed)
04/10/2013 Jessica Glass   [birth year]  [id number]  Primary Physician Rogelia Boga, MD Primary Cardiologist: Runell Gess MD [name]   HPI:  [name] is a 48 year old moderately overweight single Caucasian female the children while I saw 11/24/09. She works as a Hospital doctor. Her factors include remote tobacco abuse, hyperlipidemia and hypertension. She denies chest pain or shortness of breath. She did see me for tachypalpitations. A 2-D echo was normal as were thyroid function tests. Her symptoms are somewhat improved over last several years. She also complains of symptoms compatible type of sleep apnea. As a result of lab work which showed elevated cholesterol with an LDL of 149 I began her on atorvastatin. We will recheck a lipid and liver profile. She did have a sleep study which was abnormal this led to a prescription for CPAP which he wears and benefits from.    Current Outpatient Prescriptions  Medication Sig Dispense Refill  . Ascorbic Acid (VITAMIN C) 1000 MG tablet Take 1,000 mg by mouth daily.      Marland Kitchen atorvastatin (LIPITOR) 20 MG tablet TAKE 1 TABLET DAILY  90 tablet  0  . benazepril-hydrochlorthiazide (LOTENSIN HCT) 20-12.5 MG per tablet Take 1 tablet by mouth daily.  90 tablet  6  . cetirizine (ZYRTEC) 10 MG tablet Take 10 mg by mouth daily.      . chlorhexidine (PERIDEX) 0.12 % solution Use as directed 15 mLs in the mouth or throat 2 (two) times daily.       . diclofenac (VOLTAREN) 75 MG EC tablet Take 75 mg by mouth 2 (two) times daily.      . DRYSOL 20 % external solution Apply 1 application topically every other day.      Marland Kitchen EPIPEN 2-PAK 0.3 MG/0.3ML SOAJ Inject 0.3 mg into the skin as needed.      . fluticasone (FLONASE) 50 MCG/ACT nasal spray Place 1 spray into both nostrils daily.      . Ginkgo Biloba 40 MG TABS Take 120 mg by mouth daily.      [name] (GLUCOSAMINE MSM COMPLEX PO) Take 1,500 mg by mouth 2 (two) times  daily.      Marland Kitchen HYDROcodone-acetaminophen (NORCO/VICODIN) 5-325 MG per tablet Take 1 tablet by mouth as needed.      . niacin (SLO-NIACIN) 500 MG tablet Take 500 mg by mouth every other day.      . pantoprazole (PROTONIX) 40 MG tablet Take 1 tablet (40 mg total) by mouth daily.  90 tablet  6   No current facility-administered medications for this visit.    No Known Allergies  History   Social History  . Marital Status: Single    Spouse Name: N/A    Number of Children: N/A  . Years of Education: N/A   Occupational History  . Not on file.   Social History Main Topics  . Smoking status: Former Smoker -- 0.25 packs/day    Types: Cigarettes    Quit date: 06/06/2007  . Smokeless tobacco: Never Used  . Alcohol Use: No  . Drug Use: No  . Sexual Activity: Not on file   Other Topics Concern  . Not on file   Social History Narrative  . No narrative on file     Review of Systems: General: negative for chills, fever, night sweats or weight changes.  Cardiovascular: negative for chest pain, dyspnea on exertion, edema, orthopnea, palpitations, paroxysmal nocturnal dyspnea or shortness of breath Dermatological: negative for rash  Respiratory: negative for cough or wheezing Urologic: negative for hematuria Abdominal: negative for nausea, vomiting, diarrhea, bright red blood per rectum, melena, or hematemesis Neurologic: negative for visual changes, syncope, or dizziness All other systems reviewed and are otherwise negative except as noted above.    Blood pressure 122/94, pulse 92, height 5\' 5"  (1.651 m), weight 210 lb 4.8 oz (95.391 kg).  General appearance: alert and no distress Neck: no adenopathy, no carotid bruit, no JVD, supple, symmetrical, trachea midline and thyroid not enlarged, symmetric, no tenderness/mass/nodules Lungs: clear to auscultation bilaterally Heart: regular rate and rhythm, S1, S2 normal, no murmur, click, rub or gallop Extremities: extremities normal,  atraumatic, no cyanosis or edema  EKG normal sinus rhythm at 92 with no ST or T wave changes  ASSESSMENT AND PLAN:   HYPERTENSION  Controlled on current medications  Hyperlipidemia On statin therapy. We will recheck a lipid and liver profile  Obstructive sleep apnea She did have a positive sleep study and is currently wearing and benefiting from CPAP      Runell GessJonathan J. Lamya Lausch MD [facility] HospitalFACP,FACC,FAHA, [facility] CenterFSCAI 04/10/2013 9:55 AM

## 2013-04-10 NOTE — Patient Instructions (Signed)
Dr Berry has ordered you to have some blood work to be done FASTING. (Lipid, Liver)  Dr Berry wants you to follow-up in 1 year. You will receive a reminder letter in the mail one months in advance. If you don't receive a letter, please call our office to schedule the follow-up appointment. 

## 2013-04-10 NOTE — Assessment & Plan Note (Signed)
On statin therapy. We will recheck a lipid and liver profile 

## 2013-04-14 ENCOUNTER — Telehealth: Payer: Self-pay | Admitting: Internal Medicine

## 2013-04-14 DIAGNOSIS — E785 Hyperlipidemia, unspecified: Secondary | ICD-10-CM

## 2013-04-14 DIAGNOSIS — I10 Essential (primary) hypertension: Secondary | ICD-10-CM

## 2013-04-14 NOTE — Telephone Encounter (Signed)
Pt would like to combine her cpe labs w/ the ones she is getting for dr berry at [facility]. Can you put the order in the computer so they can see? Dr Hazle CocaBerry's lab request are already there. Pt is planning to go in the am. Is that ok?

## 2013-04-14 NOTE — Telephone Encounter (Signed)
Spoke to pt told her lab orders were added to orders for physical. Pt verbalized understanding.

## 2013-04-15 ENCOUNTER — Other Ambulatory Visit: Payer: Self-pay | Admitting: Internal Medicine

## 2013-04-15 DIAGNOSIS — I10 Essential (primary) hypertension: Secondary | ICD-10-CM

## 2013-04-15 DIAGNOSIS — E785 Hyperlipidemia, unspecified: Secondary | ICD-10-CM

## 2013-04-15 LAB — CBC WITH DIFFERENTIAL/PLATELET
BASOS ABS: 0.1 10*3/uL (ref 0.0–0.1)
Basophils Relative: 1 % (ref 0–1)
EOS PCT: 4 % (ref 0–5)
Eosinophils Absolute: 0.3 10*3/uL (ref 0.0–0.7)
HEMATOCRIT: 37.1 % (ref 36.0–46.0)
HEMOGLOBIN: 12.4 g/dL (ref 12.0–15.0)
LYMPHS ABS: 1.6 10*3/uL (ref 0.7–4.0)
LYMPHS PCT: 25 % (ref 12–46)
MCH: 28.5 pg (ref 26.0–34.0)
MCHC: 33.4 g/dL (ref 30.0–36.0)
MCV: 85.3 fL (ref 78.0–100.0)
MONO ABS: 0.5 10*3/uL (ref 0.1–1.0)
MONOS PCT: 8 % (ref 3–12)
NEUTROS ABS: 4 10*3/uL (ref 1.7–7.7)
Neutrophils Relative %: 62 % (ref 43–77)
Platelets: 295 10*3/uL (ref 150–400)
RBC: 4.35 MIL/uL (ref 3.87–5.11)
RDW: 14.3 % (ref 11.5–15.5)
WBC: 6.5 10*3/uL (ref 4.0–10.5)

## 2013-04-15 LAB — BASIC METABOLIC PANEL
BUN: 17 mg/dL (ref 6–23)
CO2: 27 meq/L (ref 19–32)
Calcium: 8.9 mg/dL (ref 8.4–10.5)
Chloride: 105 mEq/L (ref 96–112)
Creat: 0.55 mg/dL (ref 0.50–1.10)
Glucose, Bld: 101 mg/dL — ABNORMAL HIGH (ref 70–99)
POTASSIUM: 3.8 meq/L (ref 3.5–5.3)
Sodium: 137 mEq/L (ref 135–145)

## 2013-04-15 LAB — HEPATIC FUNCTION PANEL
ALT: 21 U/L (ref 0–35)
AST: 16 U/L (ref 0–37)
Albumin: 3.8 g/dL (ref 3.5–5.2)
Alkaline Phosphatase: 50 U/L (ref 39–117)
BILIRUBIN INDIRECT: 0.3 mg/dL (ref 0.2–1.2)
Bilirubin, Direct: 0.1 mg/dL (ref 0.0–0.3)
TOTAL PROTEIN: 6.2 g/dL (ref 6.0–8.3)
Total Bilirubin: 0.4 mg/dL (ref 0.2–1.2)

## 2013-04-15 LAB — TSH: TSH: 4.018 u[IU]/mL (ref 0.350–4.500)

## 2013-04-15 LAB — LIPID PANEL
CHOL/HDL RATIO: 3.3 ratio
Cholesterol: 121 mg/dL (ref 0–200)
HDL: 37 mg/dL — ABNORMAL LOW (ref >39)
LDL CALC: 70 mg/dL (ref 0–99)
Triglycerides: 71 mg/dL (ref <150)
VLDL: 14 mg/dL (ref 0–40)

## 2013-04-16 LAB — URINALYSIS, ROUTINE W REFLEX MICROSCOPIC
BILIRUBIN URINE: NEGATIVE
Glucose, UA: NEGATIVE mg/dL
Hgb urine dipstick: NEGATIVE
Ketones, ur: NEGATIVE mg/dL
LEUKOCYTES UA: NEGATIVE
Nitrite: NEGATIVE
PH: 5.5 (ref 5.0–8.0)
Protein, ur: NEGATIVE mg/dL
Specific Gravity, Urine: 1.03 (ref 1.005–1.030)
Urobilinogen, UA: 0.2 mg/dL (ref 0.0–1.0)

## 2013-04-20 ENCOUNTER — Encounter: Payer: Self-pay | Admitting: *Deleted

## 2013-04-30 ENCOUNTER — Other Ambulatory Visit: Payer: BC Managed Care – PPO

## 2013-04-30 ENCOUNTER — Other Ambulatory Visit: Payer: Self-pay | Admitting: Internal Medicine

## 2013-05-01 ENCOUNTER — Other Ambulatory Visit: Payer: Self-pay | Admitting: Internal Medicine

## 2013-05-07 ENCOUNTER — Ambulatory Visit (INDEPENDENT_AMBULATORY_CARE_PROVIDER_SITE_OTHER): Payer: BC Managed Care – PPO | Admitting: Internal Medicine

## 2013-05-07 ENCOUNTER — Encounter: Payer: Self-pay | Admitting: Internal Medicine

## 2013-05-07 VITALS — BP 126/84 | HR 67 | Temp 98.4°F | Resp 20 | Ht 64.75 in | Wt 216.0 lb

## 2013-05-07 DIAGNOSIS — J309 Allergic rhinitis, unspecified: Secondary | ICD-10-CM

## 2013-05-07 DIAGNOSIS — E785 Hyperlipidemia, unspecified: Secondary | ICD-10-CM

## 2013-05-07 DIAGNOSIS — Z0000 Encounter for general adult medical examination without abnormal findings: Secondary | ICD-10-CM

## 2013-05-07 DIAGNOSIS — M549 Dorsalgia, unspecified: Secondary | ICD-10-CM

## 2013-05-07 DIAGNOSIS — I10 Essential (primary) hypertension: Secondary | ICD-10-CM

## 2013-05-07 DIAGNOSIS — G4733 Obstructive sleep apnea (adult) (pediatric): Secondary | ICD-10-CM

## 2013-05-07 DIAGNOSIS — K219 Gastro-esophageal reflux disease without esophagitis: Secondary | ICD-10-CM

## 2013-05-07 MED ORDER — PHENTERMINE HCL 37.5 MG PO CAPS: 37.5000 mg | ORAL_CAPSULE | ORAL | Status: DC

## 2013-05-07 MED ORDER — FLUTICASONE PROPIONATE 50 MCG/ACT NA SUSP: 1.0000 | Freq: Every day | NASAL | Status: DC

## 2013-05-07 NOTE — Progress Notes (Signed)
Subjective:    Patient ID: Jessica Glass, female    DOB: 03-28-1965, 48 y.o.   MRN: [id number]  HPI 28 -year-old patient who is seen today for an annual preventive health examination. She  reestablished  with our practice about one year ago. She has long history of treated hypertension which has been controlled on combination therapy. Medical problems include gastroesophageal reflux disease allergic rhinitis. She has a history of migraine headaches low back pain and urinary incontinence. Surgical procedures have included a remote tonsillectomy bladder sling procedure in 2014 and knee surgery in 1983.  Patient has been followed by [facility] heart and vascular and has had recent laboratory studies including a lipid panel. She has been placed on statin therapy recently.  She has a history of exercise associated tachycardia  Family history father age 62 history of hypertension status post cholecystectomy Mother died age 76 of ovarian cancer history of hypertension and diabetes 3 sisters posture hypertension and dyslipidemia One brother is well Tobacco use  Past Medical History  Diagnosis Date  . Arrhythmia 05/26/09    ECHO ALL CHAMBERS  ARE NORMAL IN SIZE AND FUNCTION EF70%  . Hypertension   . Hyperlipidemia   . GERD (gastroesophageal reflux disease)   . Seasonal allergies   . Obstructive sleep apnea     currently wearing CPAP    History   Social History  . Marital Status: Single    Spouse Name: N/A    Number of Children: N/A  . Years of Education: N/A   Occupational History  . Not on file.   Social History Main Topics  . Smoking status: Former Smoker -- 0.25 packs/day    Types: Cigarettes    Quit date: 06/06/2007  . Smokeless tobacco: Never Used  . Alcohol Use: No  . Drug Use: No  . Sexual Activity: Not on file   Other Topics Concern  . Not on file   Social History Narrative  . No narrative on file    Past Surgical History  Procedure Laterality Date  .  Tonsillectomy    . Adenoidectomy    . Debridement tennis elbow    . Patella reconstruction Left   . Bladder suspension      Family History  Problem Relation Age of Onset  . Cancer Mother     ovarian  . Hypertension Mother   . Diabetes Mother   . Heart disease Mother   . Hyperlipidemia Father   . Hypertension Father   . Cancer Maternal Aunt     breast    No Known Allergies  Current Outpatient Prescriptions on File Prior to Visit  Medication Sig Dispense Refill  . Ascorbic Acid (VITAMIN C) 1000 MG tablet Take 1,000 mg by mouth daily.      Marland Kitchen atorvastatin (LIPITOR) 20 MG tablet TAKE 1 TABLET DAILY  90 tablet  0  . benazepril-hydrochlorthiazide (LOTENSIN HCT) 20-12.5 MG per tablet TAKE 1 TABLET DAILY  90 tablet  3  . cetirizine (ZYRTEC) 10 MG tablet Take 10 mg by mouth daily.      . chlorhexidine (PERIDEX) 0.12 % solution Use as directed 15 mLs in the mouth or throat 2 (two) times daily.       . diclofenac (VOLTAREN) 75 MG EC tablet Take 75 mg by mouth 2 (two) times daily.      . DRYSOL 20 % external solution Apply 1 application topically every other day.      Marland Kitchen EPIPEN 2-PAK 0.3 MG/0.3ML SOAJ Inject 0.3  mg into the skin as needed.      . Ginkgo Biloba 40 MG TABS Take 120 mg by mouth daily.      Donnie Aho (GLUCOSAMINE MSM COMPLEX PO) Take 1,500 mg by mouth 2 (two) times daily.      Marland Kitchen HYDROcodone-acetaminophen (NORCO/VICODIN) 5-325 MG per tablet Take 1 tablet by mouth as needed.      . niacin (SLO-NIACIN) 500 MG tablet Take 500 mg by mouth every other day.      . pantoprazole (PROTONIX) 40 MG tablet TAKE 1 TABLET DAILY  90 tablet  3   No current facility-administered medications on file prior to visit.    BP 126/84  Pulse 67  Temp(Src) 98.4 F (36.9 C) (Oral)  Resp 20  Ht 5' 4.75" (1.645 m)  Wt 216 lb (97.977 kg)  BMI 36.21 kg/m2  SpO2 98%  LMP 04/30/2013      Review of Systems  Constitutional: Negative for fever, appetite change, fatigue and  unexpected weight change.  HENT: Negative for congestion, dental problem, ear pain, hearing loss, mouth sores, nosebleeds, sinus pressure, sore throat, tinnitus, trouble swallowing and voice change.   Eyes: Negative for photophobia, pain, redness and visual disturbance.  Respiratory: Negative for cough, chest tightness and shortness of breath.   Cardiovascular: Negative for chest pain, palpitations and leg swelling.  Gastrointestinal: Negative for nausea, vomiting, abdominal pain, diarrhea, constipation, blood in stool, abdominal distention and rectal pain.  Genitourinary: Negative for dysuria, urgency, frequency, hematuria, flank pain, vaginal bleeding, vaginal discharge, difficulty urinating, genital sores, vaginal pain, menstrual problem and pelvic pain.  Musculoskeletal: Negative for arthralgias, back pain and neck stiffness.       Bilateral knee pain Status post bilateral joint injections today by orthopedics  Skin: Negative for rash.  Neurological: Negative for dizziness, syncope, speech difficulty, weakness, light-headedness, numbness and headaches.  Hematological: Negative for adenopathy. Does not bruise/bleed easily.  Psychiatric/Behavioral: Negative for suicidal ideas, behavioral problems, self-injury, dysphoric mood and agitation. The patient is not nervous/anxious.        Objective:   Physical Exam  Constitutional: She is oriented to person, place, and time. She appears well-developed and well-nourished.  Repeat blood pressure 120/80  HENT:  Head: Normocephalic and atraumatic.  Right Ear: External ear normal.  Left Ear: External ear normal.  Mouth/Throat: Oropharynx is clear and moist.  Eyes: Conjunctivae and EOM are normal.  Neck: Normal range of motion. Neck supple. No JVD present. No thyromegaly present.  Cardiovascular: Normal rate, regular rhythm, normal heart sounds and intact distal pulses.   No murmur heard. Pull shirt posture 100  Pulmonary/Chest: Effort normal and  breath sounds normal. She has no wheezes. She has no rales.  Abdominal: Soft. Bowel sounds are normal. She exhibits no distension and no mass. There is no tenderness. There is no rebound and no guarding.  Genitourinary: Vagina normal.  Musculoskeletal: Normal range of motion. She exhibits no edema and no tenderness.  Neurological: She is alert and oriented to person, place, and time. She has normal reflexes. No cranial nerve deficit. She exhibits normal muscle tone. Coordination normal.  Skin: Skin is warm and dry. No rash noted.  Psychiatric: She has a normal mood and affect. Her behavior is normal.    Results for orders placed in visit on 04/15/13  CBC WITH DIFFERENTIAL      Result Value Ref Range   WBC 6.5  4.0 - 10.5 K/uL   RBC 4.35  3.87 - 5.11 MIL/uL   Hemoglobin  12.4  12.0 - 15.0 g/dL   HCT 37.1  36.0 - 46.0 %   MCV 85.3  78.0 - 100.0 fL   MCH 28.5  26.0 - 34.0 pg   MCHC 33.4  30.0 - 36.0 g/dL   RDW 14.3  11.5 - 15.5 %   Platelets 295  150 - 400 K/uL   Neutrophils Relative % 62  43 - 77 %   Neutro Abs 4.0  1.7 - 7.7 K/uL   Lymphocytes Relative 25  12 - 46 %   Lymphs Abs 1.6  0.7 - 4.0 K/uL   Monocytes Relative 8  3 - 12 %   Monocytes Absolute 0.5  0.1 - 1.0 K/uL   Eosinophils Relative 4  0 - 5 %   Eosinophils Absolute 0.3  0.0 - 0.7 K/uL   Basophils Relative 1  0 - 1 %   Basophils Absolute 0.1  0.0 - 0.1 K/uL   Smear Review Criteria for review not met    BASIC METABOLIC PANEL      Result Value Ref Range   Sodium 137  135 - 145 mEq/L   Potassium 3.8  3.5 - 5.3 mEq/L   Chloride 105  96 - 112 mEq/L   CO2 27  19 - 32 mEq/L   Glucose, Bld 101 (*) 70 - 99 mg/dL   BUN 17  6 - 23 mg/dL   Creat 0.55  0.50 - 1.10 mg/dL   Calcium 8.9  8.4 - 10.5 mg/dL  TSH      Result Value Ref Range   TSH 4.018  0.350 - 4.500 uIU/mL  URINALYSIS, ROUTINE W REFLEX MICROSCOPIC      Result Value Ref Range   Color, Urine YELLOW  YELLOW   APPearance CLEAR  CLEAR   Specific Gravity, Urine 1.030   1.005 - 1.030   pH 5.5  5.0 - 8.0   Glucose, UA NEG  NEG mg/dL   Bilirubin Urine NEG  NEG   Ketones, ur NEG  NEG mg/dL   Hgb urine dipstick NEG  NEG   Protein, ur NEG  NEG mg/dL   Urobilinogen, UA 0.2  0.0 - 1.0 mg/dL   Nitrite NEG  NEG   Leukocytes, UA NEG  NEG        Assessment & Plan:  Preventive health examination Hypertension Exogenous obesity Dyslipidemia.  Continue statin therapy  Weight loss encouraged We'll continue low-salt diet Exercise regimen recommended Recheck 6 months

## 2013-05-07 NOTE — Patient Instructions (Addendum)
Limit your sodium (Salt) intake    It is important that you exercise regularly, at least 20 minutes 3 to 4 times per week.  If you develop chest pain or shortness of breath seek  medical attention.  You need to lose weight.  Consider a lower calorie diet and regular exercise.  Please check your blood pressure on a regular basis.  If it is consistently greater than 150/90, please make an office appointment.  Consider [facility]: Call [phone] Rhinitis Allergic rhinitis is when the mucous membranes in the nose respond to allergens. Allergens are particles in the air that cause your body to have an allergic reaction. This causes you to release allergic antibodies. Through a chain of events, these eventually cause you to release histamine into the blood stream. Although meant to protect the body, it is this release of histamine that causes your discomfort, such as frequent sneezing, congestion, and an itchy, runny nose.  CAUSES  Seasonal allergic rhinitis (hay fever) is caused by pollen allergens that may come from grasses, trees, and weeds. Year-round allergic rhinitis (perennial allergic rhinitis) is caused by allergens such as house dust mites, pet dander, and mold spores.  SYMPTOMS   Nasal stuffiness (congestion).  Itchy, runny nose with sneezing and tearing of the eyes. DIAGNOSIS  Your health care provider can help you determine the allergen or allergens that trigger your symptoms. If you and your health care provider are unable to determine the allergen, skin or blood testing may be used. TREATMENT  Allergic Rhinitis does not have a cure, but it can be controlled by:  Medicines and allergy shots (immunotherapy).  Avoiding the allergen. Hay fever may often be treated with antihistamines in pill or nasal spray forms. Antihistamines block the effects of histamine. There are over-the-counter medicines that may help with nasal congestion and swelling  around the eyes. Check with your health care provider before taking or giving this medicine.  If avoiding the allergen or the medicine prescribed do not work, there are many new medicines your health care provider can prescribe. Stronger medicine may be used if initial measures are ineffective. Desensitizing injections can be used if medicine and avoidance does not work. Desensitization is when a patient is given ongoing shots until the body becomes less sensitive to the allergen. Make sure you follow up with your health care provider if problems continue. HOME CARE INSTRUCTIONS It is not possible to completely avoid allergens, but you can reduce your symptoms by taking steps to limit your exposure to them. It helps to know exactly what you are allergic to so that you can avoid your specific triggers. SEEK MEDICAL CARE IF:   You have a fever.  You develop a cough that does not stop easily (persistent).  You have shortness of breath.  You start wheezing.  Symptoms interfere with normal daily activities. Document Released: 09/19/2000 Document Revised: 10/15/2012 Document Reviewed: 09/01/2012 [facility] Patient Information 2014 [place]. Health Maintenance, Female A healthy lifestyle and preventative care can promote health and wellness.  Maintain regular health, dental, and eye exams.  Eat a healthy diet. Foods like vegetables, fruits, whole grains, low-fat dairy products, and lean protein foods contain the nutrients you need without too many calories. Decrease your intake of foods high in solid fats, added sugars, and salt. Get information about a proper diet from your caregiver, if necessary.  Regular physical exercise is one of the most important things you can do for your health. Most  adults should get at least 150 minutes of moderate-intensity exercise (any activity that increases your heart rate and causes you to sweat) each week. In addition, most adults need muscle-strengthening  exercises on 2 or more days a week.   Maintain a healthy weight. The body mass index (BMI) is a screening tool to identify possible weight problems. It provides an estimate of body fat based on height and weight. Your caregiver can help determine your BMI, and can help you achieve or maintain a healthy weight. For adults 20 years and older:  A BMI below 18.5 is considered underweight.  A BMI of 18.5 to 24.9 is normal.  A BMI of 25 to 29.9 is considered overweight.  A BMI of 30 and above is considered obese.  Maintain normal blood lipids and cholesterol by exercising and minimizing your intake of saturated fat. Eat a balanced diet with plenty of fruits and vegetables. Blood tests for lipids and cholesterol should begin at age 20 and be repeated every 5 years. If your lipid or cholesterol levels are high, you are over 50, or you are a high risk for heart disease, you may need your cholesterol levels checked more frequently.Ongoing high lipid and cholesterol levels should be treated with medicines if diet and exercise are not effective.  If you smoke, find out from your caregiver how to quit. If you do not use tobacco, do not start.  Lung cancer screening is recommended for adults aged 62 80 years who are at high risk for developing lung cancer because of a history of smoking. Yearly low-dose computed tomography (CT) is recommended for people who have at least a 30-pack-year history of smoking and are a current smoker or have quit within the past 15 years. A pack year of smoking is smoking an average of 1 pack of cigarettes a day for 1 year (for example: 1 pack a day for 30 years or 2 packs a day for 15 years). Yearly screening should continue until the smoker has stopped smoking for at least 15 years. Yearly screening should also be stopped for people who develop a health problem that would prevent them from having lung cancer treatment.  If you are pregnant, do not drink alcohol. If you are  breastfeeding, be very cautious about drinking alcohol. If you are not pregnant and choose to drink alcohol, do not exceed 1 drink per day. One drink is considered to be 12 ounces (355 mL) of beer, 5 ounces (148 mL) of wine, or 1.5 ounces (44 mL) of liquor.  Avoid use of street drugs. Do not share needles with anyone. Ask for help if you need support or instructions about stopping the use of drugs.  High blood pressure causes heart disease and increases the risk of stroke. Blood pressure should be checked at least every 1 to 2 years. Ongoing high blood pressure should be treated with medicines, if weight loss and exercise are not effective.  If you are 51 to 49 years old, ask your caregiver if you should take aspirin to prevent strokes.  Diabetes screening involves taking a blood sample to check your fasting blood sugar level. This should be done once every 3 years, after age 50, if you are within normal weight and without risk factors for diabetes. Testing should be considered at a younger age or be carried out more frequently if you are overweight and have at least 1 risk factor for diabetes.  Breast cancer screening is essential preventative care for women. You  should practice "breast self-awareness." This means understanding the normal appearance and feel of your breasts and may include breast self-examination. Any changes detected, no matter how small, should be reported to a caregiver. Women in their 68s and 30s should have a clinical breast exam (CBE) by a caregiver as part of a regular health exam every 1 to 3 years. After age 68, women should have a CBE every year. Starting at age 18, women should consider having a mammogram (breast X-ray) every year. Women who have a family history of breast cancer should talk to their caregiver about genetic screening. Women at a high risk of breast cancer should talk to their caregiver about having an MRI and a mammogram every year.  Breast cancer gene  (BRCA)-related cancer risk assessment is recommended for women who have family members with BRCA-related cancers. BRCA-related cancers include breast, ovarian, tubal, and peritoneal cancers. Having family members with these cancers may be associated with an increased risk for harmful changes (mutations) in the breast cancer genes BRCA1 and BRCA2. Results of the assessment will determine the need for genetic counseling and BRCA1 and BRCA2 testing.  The Pap test is a screening test for cervical cancer. Women should have a Pap test starting at age 33. Between ages 73 and 65, Pap tests should be repeated every 2 years. Beginning at age 87, you should have a Pap test every 3 years as long as the past 3 Pap tests have been normal. If you had a hysterectomy for a problem that was not cancer or a condition that could lead to cancer, then you no longer need Pap tests. If you are between ages 24 and 52, and you have had normal Pap tests going back 10 years, you no longer need Pap tests. If you have had past treatment for cervical cancer or a condition that could lead to cancer, you need Pap tests and screening for cancer for at least 20 years after your treatment. If Pap tests have been discontinued, risk factors (such as a new sexual partner) need to be reassessed to determine if screening should be resumed. Some women have medical problems that increase the chance of getting cervical cancer. In these cases, your caregiver may recommend more frequent screening and Pap tests.  The human papillomavirus (HPV) test is an additional test that may be used for cervical cancer screening. The HPV test looks for the virus that can cause the cell changes on the cervix. The cells collected during the Pap test can be tested for HPV. The HPV test could be used to screen women aged 70 years and older, and should be used in women of any age who have unclear Pap test results. After the age of 66, women should have HPV testing at the same  frequency as a Pap test.  Colorectal cancer can be detected and often prevented. Most routine colorectal cancer screening begins at the age of 47 and continues through age 27. However, your caregiver may recommend screening at an earlier age if you have risk factors for colon cancer. On a yearly basis, your caregiver may provide home test kits to check for hidden blood in the stool. Use of a small camera at the end of a tube, to directly examine the colon (sigmoidoscopy or colonoscopy), can detect the earliest forms of colorectal cancer. Talk to your caregiver about this at age 54, when routine screening begins. Direct examination of the colon should be repeated every 5 to 10 years through age 72,  unless early forms of pre-cancerous polyps or small growths are found.  Hepatitis C blood testing is recommended for all people born from [birth year] through 1966 and any individual with known risks for hepatitis C.  Practice safe sex. Use condoms and avoid high-risk sexual practices to reduce the spread of sexually transmitted infections (STIs). Sexually active women aged 85 and younger should be checked for Chlamydia, which is a common sexually transmitted infection. Older women with new or multiple partners should also be tested for Chlamydia. Testing for other STIs is recommended if you are sexually active and at increased risk.  Osteoporosis is a disease in which the bones lose minerals and strength with aging. This can result in serious bone fractures. The risk of osteoporosis can be identified using a bone density scan. Women ages 22 and over and women at risk for fractures or osteoporosis should discuss screening with their caregivers. Ask your caregiver whether you should be taking a calcium supplement or vitamin D to reduce the rate of osteoporosis.  Menopause can be associated with physical symptoms and risks. Hormone replacement therapy is available to decrease symptoms and risks. You should talk to your  caregiver about whether hormone replacement therapy is right for you.  Use sunscreen. Apply sunscreen liberally and repeatedly throughout the day. You should seek shade when your shadow is shorter than you. Protect yourself by wearing long sleeves, pants, a wide-brimmed hat, and sunglasses year round, whenever you are outdoors.  Notify your caregiver of new moles or changes in moles, especially if there is a change in shape or color. Also notify your caregiver if a mole is larger than the size of a pencil eraser.  Stay current with your immunizations. Document Released: 07/10/2010 Document Revised: 04/21/2012 Document Reviewed: 07/10/2010 [facility] Patient Information 2014 [place].

## 2013-05-07 NOTE — Progress Notes (Signed)
Pre-visit discussion using our clinic review tool. No additional management support is needed unless otherwise documented below in the visit note.  

## 2013-05-08 ENCOUNTER — Telehealth: Payer: Self-pay | Admitting: Internal Medicine

## 2013-05-08 NOTE — Telephone Encounter (Signed)
Relevant patient education assigned to patient using Emmi. ° °

## 2013-05-15 ENCOUNTER — Telehealth: Payer: Self-pay | Admitting: Internal Medicine

## 2013-05-15 NOTE — Telephone Encounter (Signed)
Please see message and advise 

## 2013-05-15 NOTE — Telephone Encounter (Signed)
Spoke to pt told her PA on Phentermine has been denied by [employer] [facility]. Told her must have engaged in behavioral modification and dietary restriction for at least 3 months. Asked her if she would like to pursue these recommendations per Dr. Kirtland BouchardK. Pt verbalized understanding and stated she has the prescription and she will just go to local pharmacy and get the Rx. Told her okay,but she might have to pay more. Pt verbalized understanding.

## 2013-05-15 NOTE — Telephone Encounter (Signed)
Notify patient in see if she wants to pursue these recommendations

## 2013-05-15 NOTE — Telephone Encounter (Signed)
[facility] denied PA for Phentermine.  Pt must have engaged in a trial of behavioral modification and dietary restriction for at least 3 months.

## 2013-05-18 NOTE — Telephone Encounter (Signed)
Pt wants to know if Dr. Kirtland BouchardK will write a letter for her and appeal the PA denial.

## 2013-05-27 ENCOUNTER — Other Ambulatory Visit: Payer: Self-pay | Admitting: Cardiovascular Disease

## 2013-05-27 NOTE — Telephone Encounter (Signed)
Rx was sent to pharmacy electronically. 

## 2013-11-06 ENCOUNTER — Ambulatory Visit: Payer: BC Managed Care – PPO | Admitting: Internal Medicine

## 2013-12-29 ENCOUNTER — Other Ambulatory Visit: Payer: Self-pay | Admitting: Cardiovascular Disease

## 2013-12-29 NOTE — Telephone Encounter (Signed)
Rx(s) sent to pharmacy electronically.  

## 2014-01-06 ENCOUNTER — Encounter: Payer: Self-pay | Admitting: *Deleted

## 2014-01-06 ENCOUNTER — Emergency Department (INDEPENDENT_AMBULATORY_CARE_PROVIDER_SITE_OTHER)
Admission: EM | Admit: 2014-01-06 | Discharge: 2014-01-06 | Disposition: A | Discharge status: Home / Self Care | Payer: BC Managed Care – PPO | Source: Home / Self Care | Attending: Family Medicine | Admitting: Family Medicine

## 2014-01-06 DIAGNOSIS — M7712 Lateral epicondylitis, left elbow: Secondary | ICD-10-CM

## 2014-01-06 DIAGNOSIS — M7711 Lateral epicondylitis, right elbow: Secondary | ICD-10-CM

## 2014-01-06 DIAGNOSIS — M7701 Medial epicondylitis, right elbow: Secondary | ICD-10-CM

## 2014-01-06 DIAGNOSIS — M7702 Medial epicondylitis, left elbow: Secondary | ICD-10-CM

## 2014-01-06 DIAGNOSIS — M170 Bilateral primary osteoarthritis of knee: Secondary | ICD-10-CM

## 2014-01-06 MED ORDER — DICLOFENAC SODIUM 1 % TD GEL: 1.0000 "application " | Freq: Three times a day (TID) | TRANSDERMAL | Status: DC

## 2014-01-06 MED ORDER — DICLOFENAC EPOLAMINE 1.3 % TD PTCH: MEDICATED_PATCH | TRANSDERMAL | Status: DC

## 2014-01-06 MED ORDER — HYDROCODONE-ACETAMINOPHEN 5-325 MG PO TABS: ORAL_TABLET | ORAL | Status: DC

## 2014-01-06 NOTE — Discharge Instructions (Signed)
Wear knee braces daytime.  Wear elbow braces daytime. Apply ice pack for 15 to 20 minutes, 3 to 4 times daily  Continue until pain decreases.     Medial Epicondylitis (Golfer's Elbow) with Rehab Medial epicondylitis involves inflammation and pain around the inner (medial) portion of the elbow. This pain is caused by inflammation of the tendons in the forearm that flex (bring down) the wrist. Medial epicondylitis is also called golfer's elbow, because it is common among golfers. However, it may occur in any individual who flexes the wrist regularly. If medial epicondylitis is left untreated, it may become a chronic problem. SYMPTOMS   Pain, tenderness, or inflammation over the inner (medial) side of the elbow.  Pain or weakness with gripping activities.  Pain that increases with wrist twisting motions (using a screwdriver, playing golf, bowling). CAUSES  Medial epicondylitis is caused by inflammation of the tendons that flex the wrist. Causes of injury may include:  Chronic, repetitive stress and strain to the tendons that run from the wrist and forearm to the elbow.  Sudden strain on the forearm, including wrist snap when serving balls with racquet sports, or throwing a baseball. RISK INCREASES WITH:  Sports or occupations that require repetitive and/or strenuous forearm and wrist movements (pitching a baseball, golfing, carpentry).  Poor wrist and forearm strength and flexibility.  Failure to warm up properly before activity.  Resuming activity before healing, rehabilitation, and conditioning are complete. PREVENTION   Warm up and stretch properly before activity.  Maintain physical fitness:  Strength, flexibility, and endurance.  Cardiovascular fitness.  Wear and use properly fitted equipment.  Learn and use proper technique and have a coach correct improper technique.  Wear a tennis elbow (counterforce) brace. PROGNOSIS  The course of this condition depends on the  degree of the injury. If treated properly, acute cases (symptoms lasting less than 4 weeks) are often resolved in 2 to 6 weeks. Chronic (longer lasting cases) often resolve in 3 to 6 months, but may require physical therapy. RELATED COMPLICATIONS   Frequently recurring symptoms, resulting in a chronic problem. Properly treating the problem the first time decreases frequency of recurrence.  Chronic inflammation, scarring, and partial tendon tear, requiring surgery.  Delayed healing or resolution of symptoms. TREATMENT  Treatment first involves the use of ice and medicine, to reduce pain and inflammation. Strengthening and stretching exercises may reduce discomfort, if performed regularly. These exercises may be performed at home, if the condition is an acute injury. Chronic cases may require a referral to a physical therapist for evaluation and treatment. Your caregiver may advise a corticosteroid injection to help reduce inflammation. Rarely, surgery is needed. MEDICATION  If pain medicine is needed, nonsteroidal anti-inflammatory medicines (aspirin and ibuprofen), or other minor pain relievers (acetaminophen), are often advised.  Do not take pain medicine for 7 days before surgery.  Prescription pain relievers may be given, if your caregiver thinks they are needed. Use only as directed and only as much as you need.  Corticosteroid injections may be recommended. These injections should be reserved only for the most severe cases, because they can only be given a certain number of times. HEAT AND COLD  Cold treatment (icing) should be applied for 10 to 15 minutes every 2 to 3 hours for inflammation and pain, and immediately after activity that aggravates your symptoms. Use ice packs or an ice massage.  Heat treatment may be used before performing stretching and strengthening activities prescribed by your caregiver, physical therapist, or athletic  trainer. Use a heat pack or a warm water  soak. SEEK MEDICAL CARE IF: Symptoms get worse or do not improve in 2 weeks, despite treatment. EXERCISES  RANGE OF MOTION (ROM) AND STRETCHING EXERCISES - Epicondylitis, Medial (Golfer's Elbow) These exercises may help you when beginning to rehabilitate your injury. Your symptoms may go away with or without further involvement from your physician, physical therapist or athletic trainer. While completing these exercises, remember:   Restoring tissue flexibility helps normal motion to return to the joints. This allows healthier, less painful movement and activity.  An effective stretch should be held for at least 30 seconds.  A stretch should never be painful. You should only feel a gentle lengthening or release in the stretched tissue. RANGE OF MOTION - Wrist Flexion, Active-Assisted  Extend your right / left elbow with your fingers pointing down.*  Gently pull the back of your hand towards you, until you feel a gentle stretch on the top of your forearm.  Hold this position for __________ seconds. Repeat __________ times. Complete this exercise __________ times per day.  *If directed by your physician, physical therapist or athletic trainer, complete this stretch with your elbow bent, rather than extended. RANGE OF MOTION - Wrist Extension, Active-Assisted  Extend your right / left elbow and turn your palm upwards.*  Gently pull your palm and fingertips back, so your wrist extends and your fingers point more toward the ground.  You should feel a gentle stretch on the inside of your forearm.  Hold this position for __________ seconds. Repeat __________ times. Complete this exercise __________ times per day. *If directed by your physician, physical therapist or athletic trainer, complete this stretch with your elbow bent, rather than extended. STRETCH - Wrist Extension   Place your right / left fingertips on a tabletop leaving your elbow slightly bent. Your fingers should point  backwards.  Gently press your fingers and palm down onto the table, by straightening your elbow. You should feel a stretch on the inside of your forearm.  Hold this position for __________ seconds. Repeat __________ times. Complete this stretch __________ times per day.  STRENGTHENING EXERCISES - Epicondylitis, Medial (Golfer's Elbow) These exercises may help you when beginning to rehabilitate your injury. They may resolve your symptoms with or without further involvement from your physician, physical therapist or athletic trainer. While completing these exercises, remember:   Muscles can gain both the endurance and the strength needed for everyday activities through controlled exercises.  Complete these exercises as instructed by your physician, physical therapist or athletic trainer. Increase the resistance and repetitions only as guided.  You may experience muscle soreness or fatigue, but the pain or discomfort you are trying to eliminate should never worsen during these exercises. If this pain does get worse, stop and make sure you are following the directions exactly. If the pain is still present after adjustments, discontinue the exercise until you can discuss the trouble with your caregiver. STRENGTH - Wrist Flexors  Sit with your right / left forearm palm-up, and fully supported on a table or countertop. Your elbow should be resting below the height of your shoulder. Allow your wrist to extend over the edge of the surface.  Loosely holding a __________ weight, or a piece of rubber exercise band or tubing, slowly curl your hand up toward your forearm.  Hold this position for __________ seconds. Slowly lower the wrist back to the starting position in a controlled manner. Repeat __________ times. Complete this exercise __________  times per day.  STRENGTH - Wrist Extensors  Sit with your right / left forearm palm-down and fully supported. Your elbow should be resting below the height of  your shoulder. Allow your wrist to extend over the edge of the surface.  Loosely holding a __________ weight, or a piece of rubber exercise band or tubing, slowly curl your hand up toward your forearm.  Hold this position for __________ seconds. Slowly lower the wrist back to the starting position in a controlled manner. Repeat __________ times. Complete this exercise __________ times per day.  STRENGTH - Ulnar Deviators  Stand with a ____________________ weight in your right / left hand, or sit while holding a rubber exercise band or tubing, with your healthy arm supported on a table or countertop.  Move your wrist so that your pinkie travels toward your forearm and your thumb moves away from your forearm.  Hold this position for __________ seconds and then slowly lower the wrist back to the starting position. Repeat __________ times. Complete this exercise __________ times per day STRENGTH - Grip   Grasp a tennis ball, a dense sponge, or a large, rolled sock in your hand.  Squeeze as hard as you can, without increasing any pain.  Hold this position for __________ seconds. Release your grip slowly. Repeat __________ times. Complete this exercise __________ times per day.  STRENGTH - Forearm Supinators   Sit with your right / left forearm supported on a table, keeping your elbow below shoulder height. Rest your hand over the edge, palm down.  Gently grip a hammer or a soup ladle.  Without moving your elbow, slowly turn your palm and hand upward to a "thumbs-up" position.  Hold this position for __________ seconds. Slowly return to the starting position. Repeat __________ times. Complete this exercise __________ times per day.  STRENGTH - Forearm Pronators  Sit with your right / left forearm supported on a table, keeping your elbow below shoulder height. Rest your hand over the edge, palm up.  Gently grip a hammer or a soup ladle.  Without moving your elbow, slowly turn your palm  and hand upward to a "thumbs-up" position.  Hold this position for __________ seconds. Slowly return to the starting position. Repeat __________ times. Complete this exercise __________ times per day.  Document Released: 12/25/2004 Document Revised: 03/19/2011 Document Reviewed: 04/08/2008 [facility] Patient Information 2015 [place], [place]. This information is not intended to replace advice given to you by your health care provider. Make sure you discuss any questions you have with your health care provider.   Lateral Epicondylitis (Tennis Elbow) with Rehab Lateral epicondylitis involves inflammation and pain around the outer portion of the elbow. The pain is caused by inflammation of the tendons in the forearm that bring back (extend) the wrist. Lateral epicondylitis is also called tennis elbow, because it is very common in tennis players. However, it may occur in any individual who extends the wrist repetitively. If lateral epicondylitis is left untreated, it may become a chronic problem. SYMPTOMS   Pain, tenderness, and inflammation on the outer (lateral) side of the elbow.  Pain or weakness with gripping activities.  Pain that increases with wrist-twisting motions (playing tennis, using a screwdriver, opening a door or a jar).  Pain with lifting objects, including a coffee cup. CAUSES  Lateral epicondylitis is caused by inflammation of the tendons that extend the wrist. Causes of injury may include:  Repetitive stress and strain on the muscles and tendons that extend the wrist.  Sudden change in activity level or intensity.  Incorrect grip in racquet sports.  Incorrect grip size of racquet (often too large).  Incorrect hitting position or technique (usually backhand, leading with the elbow).  Using a racket that is too heavy. RISK INCREASES WITH:  Sports or occupations that require repetitive and/or strenuous forearm and wrist movements (tennis, squash, racquetball,  carpentry).  Poor wrist and forearm strength and flexibility.  Failure to warm up properly before activity.  Resuming activity before healing, rehabilitation, and conditioning are complete. PREVENTION   Warm up and stretch properly before activity.  Maintain physical fitness:  Strength, flexibility, and endurance.  Cardiovascular fitness.  Wear and use properly fitted equipment.  Learn and use proper technique and have a coach correct improper technique.  Wear a tennis elbow (counterforce) brace. PROGNOSIS  The course of this condition depends on the degree of the injury. If treated properly, acute cases (symptoms lasting less than 4 weeks) are often resolved in 2 to 6 weeks. Chronic (longer lasting cases) often resolve in 3 to 6 months but may require physical therapy. RELATED COMPLICATIONS   Frequently recurring symptoms, resulting in a chronic problem. Properly treating the problem the first time decreases frequency of recurrence.  Chronic inflammation, scarring tendon degeneration, and partial tendon tear, requiring surgery.  Delayed healing or resolution of symptoms. TREATMENT  Treatment first involves the use of ice and medicine to reduce pain and inflammation. Strengthening and stretching exercises may help reduce discomfort if performed regularly. These exercises may be performed at home if the condition is an acute injury. Chronic cases may require a referral to a physical therapist for evaluation and treatment. Your caregiver may advise a corticosteroid injection to help reduce inflammation. Rarely, surgery is needed. MEDICATION  If pain medicine is needed, nonsteroidal anti-inflammatory medicines (aspirin and ibuprofen), or other minor pain relievers (acetaminophen), are often advised.  Do not take pain medicine for 7 days before surgery.  Prescription pain relievers may be given, if your caregiver thinks they are needed. Use only as directed and only as much as you  need.  Corticosteroid injections may be recommended. These injections should be reserved only for the most severe cases, because they can only be given a certain number of times. HEAT AND COLD  Cold treatment (icing) should be applied for 10 to 15 minutes every 2 to 3 hours for inflammation and pain, and immediately after activity that aggravates your symptoms. Use ice packs or an ice massage.  Heat treatment may be used before performing stretching and strengthening activities prescribed by your caregiver, physical therapist, or athletic trainer. Use a heat pack or a warm water soak. SEEK MEDICAL CARE IF: Symptoms get worse or do not improve in 2 weeks, despite treatment. EXERCISES  RANGE OF MOTION (ROM) AND STRETCHING EXERCISES - Epicondylitis, Lateral (Tennis Elbow) These exercises may help you when beginning to rehabilitate your injury. Your symptoms may go away with or without further involvement from your physician, physical therapist, or athletic trainer. While completing these exercises, remember:   Restoring tissue flexibility helps normal motion to return to the joints. This allows healthier, less painful movement and activity.  An effective stretch should be held for at least 30 seconds.  A stretch should never be painful. You should only feel a gentle lengthening or release in the stretched tissue. RANGE OF MOTION - Wrist Flexion, Active-Assisted  Extend your right / left elbow with your fingers pointing down.*  Gently pull the back of your hand towards  you, until you feel a gentle stretch on the top of your forearm.  Hold this position for __________ seconds. Repeat __________ times. Complete this exercise __________ times per day.  *If directed by your physician, physical therapist or athletic trainer, complete this stretch with your elbow bent, rather than extended. RANGE OF MOTION - Wrist Extension, Active-Assisted  Extend your right / left elbow and turn your palm  upwards.*  Gently pull your palm and fingertips back, so your wrist extends and your fingers point more toward the ground.  You should feel a gentle stretch on the inside of your forearm.  Hold this position for __________ seconds. Repeat __________ times. Complete this exercise __________ times per day. *If directed by your physician, physical therapist or athletic trainer, complete this stretch with your elbow bent, rather than extended. STRETCH - Wrist Flexion  Place the back of your right / left hand on a tabletop, leaving your elbow slightly bent. Your fingers should point away from your body.  Gently press the back of your hand down onto the table by straightening your elbow. You should feel a stretch on the top of your forearm.  Hold this position for __________ seconds. Repeat __________ times. Complete this stretch __________ times per day.  STRETCH - Wrist Extension   Place your right / left fingertips on a tabletop, leaving your elbow slightly bent. Your fingers should point backwards.  Gently press your fingers and palm down onto the table by straightening your elbow. You should feel a stretch on the inside of your forearm.  Hold this position for __________ seconds. Repeat __________ times. Complete this stretch __________ times per day.  STRENGTHENING EXERCISES - Epicondylitis, Lateral (Tennis Elbow) These exercises may help you when beginning to rehabilitate your injury. They may resolve your symptoms with or without further involvement from your physician, physical therapist, or athletic trainer. While completing these exercises, remember:   Muscles can gain both the endurance and the strength needed for everyday activities through controlled exercises.  Complete these exercises as instructed by your physician, physical therapist or athletic trainer. Increase the resistance and repetitions only as guided.  You may experience muscle soreness or fatigue, but the pain or  discomfort you are trying to eliminate should never worsen during these exercises. If this pain does get worse, stop and make sure you are following the directions exactly. If the pain is still present after adjustments, discontinue the exercise until you can discuss the trouble with your caregiver. STRENGTH - Wrist Flexors  Sit with your right / left forearm palm-up and fully supported on a table or countertop. Your elbow should be resting below the height of your shoulder. Allow your wrist to extend over the edge of the surface.  Loosely holding a __________ weight, or a piece of rubber exercise band or tubing, slowly curl your hand up toward your forearm.  Hold this position for __________ seconds. Slowly lower the wrist back to the starting position in a controlled manner. Repeat __________ times. Complete this exercise __________ times per day.  STRENGTH - Wrist Extensors  Sit with your right / left forearm palm-down and fully supported on a table or countertop. Your elbow should be resting below the height of your shoulder. Allow your wrist to extend over the edge of the surface.  Loosely holding a __________ weight, or a piece of rubber exercise band or tubing, slowly curl your hand up toward your forearm.  Hold this position for __________ seconds. Slowly lower the  wrist back to the starting position in a controlled manner. Repeat __________ times. Complete this exercise __________ times per day.  STRENGTH - Ulnar Deviators  Stand with a ____________________ weight in your right / left hand, or sit while holding a rubber exercise band or tubing, with your healthy arm supported on a table or countertop.  Move your wrist, so that your pinkie travels toward your forearm and your thumb moves away from your forearm.  Hold this position for __________ seconds and then slowly lower the wrist back to the starting position. Repeat __________ times. Complete this exercise __________ times per  day STRENGTH - Radial Deviators  Stand with a ____________________ weight in your right / left hand, or sit while holding a rubber exercise band or tubing, with your injured arm supported on a table or countertop.  Raise your hand upward in front of you or pull up on the rubber tubing.  Hold this position for __________ seconds and then slowly lower the wrist back to the starting position. Repeat __________ times. Complete this exercise __________ times per day. STRENGTH - Forearm Supinators   Sit with your right / left forearm supported on a table, keeping your elbow below shoulder height. Rest your hand over the edge, palm down.  Gently grip a hammer or a soup ladle.  Without moving your elbow, slowly turn your palm and hand upward to a "thumbs-up" position.  Hold this position for __________ seconds. Slowly return to the starting position. Repeat __________ times. Complete this exercise __________ times per day.  STRENGTH - Forearm Pronators   Sit with your right / left forearm supported on a table, keeping your elbow below shoulder height. Rest your hand over the edge, palm up.  Gently grip a hammer or a soup ladle.  Without moving your elbow, slowly turn your palm and hand upward to a "thumbs-up" position.  Hold this position for __________ seconds. Slowly return to the starting position. Repeat __________ times. Complete this exercise __________ times per day.  STRENGTH - Grip  Grasp a tennis ball, a dense sponge, or a large, rolled sock in your hand.  Squeeze as hard as you can, without increasing any pain.  Hold this position for __________ seconds. Release your grip slowly. Repeat __________ times. Complete this exercise __________ times per day.  STRENGTH - Elbow Extensors, Isometric  Stand or sit upright, on a firm surface. Place your right / left arm so that your palm faces your stomach, and it is at the height of your waist.  Place your opposite hand on the  underside of your forearm. Gently push up as your right / left arm resists. Push as hard as you can with both arms, without causing any pain or movement at your right / left elbow. Hold this stationary position for __________ seconds. Gradually release the tension in both arms. Allow your muscles to relax completely before repeating. Document Released: 12/25/2004 Document Revised: 05/11/2013 Document Reviewed: 04/08/2008 [facility] Patient Information 2015 [employer], [place]. This information is not intended to replace advice given to you by your health care provider. Make sure you discuss any questions you have with your health care provider.

## 2014-01-06 NOTE — ED Notes (Signed)
Pt c/o bilateral elbow and knee pain for years, worse x .

## 2014-01-06 NOTE — ED Provider Notes (Signed)
CSN: [id number]     Arrival date & time 01/06/14  1748 History   First MD Initiated Contact with Patient 01/06/14 1816     Chief Complaint  Patient presents with  . Knee Pain  . Elbow Pain      HPI Comments: Patient has a long history of bilateral tennis elbow, with surgical debridement, and bilateral anterior knee pain.  Her pain in both elbows and knees have increased over the past month.  No recent injury or change in activities  Patient is a 48 y.o. female presenting with knee pain. The history is provided by the patient.  Knee Pain Location:  Knee Time since incident:  1 month Knee location:  L knee and R knee Pain details:    Quality:  Aching   Radiates to:  Does not radiate   Severity:  Moderate   Onset quality:  Gradual   Duration:  1 month   Timing:  Constant   Progression:  Worsening Chronicity:  Chronic Dislocation: no   Prior injury to area:  No Relieved by:  Nothing Worsened by:  Activity, extension, bearing weight and flexion Ineffective treatments:  None tried Associated symptoms: decreased ROM and stiffness   Associated symptoms: no back pain, no fever, no muscle weakness, no numbness, no swelling and no tingling   Risk factors: obesity     Past Medical History  Diagnosis Date  . Arrhythmia 05/26/09    ECHO ALL CHAMBERS  ARE NORMAL IN SIZE AND FUNCTION EF70%  . Hypertension   . Hyperlipidemia   . GERD (gastroesophageal reflux disease)   . Seasonal allergies   . Obstructive sleep apnea     currently wearing CPAP   Past Surgical History  Procedure Laterality Date  . Tonsillectomy    . Adenoidectomy    . Debridement tennis elbow    . Patella reconstruction Left   . Bladder suspension     Family History  Problem Relation Age of Onset  . Cancer Mother     ovarian  . Hypertension Mother   . Diabetes Mother   . Heart disease Mother   . Hyperlipidemia Father   . Hypertension Father   . Cancer Maternal Aunt     breast   History  Substance Use  Topics  . Smoking status: Former Smoker -- 0.25 packs/day    Types: Cigarettes    Quit date: 06/06/2007  . Smokeless tobacco: Never Used  . Alcohol Use: No   OB History    No data available     Review of Systems  Constitutional: Negative for fever.  Musculoskeletal: Positive for stiffness. Negative for back pain.       Bilateral elbow pain, not improved with compression sleeves    Allergies  Review of patient's allergies indicates no known allergies.  Home Medications   Prior to Admission medications   Medication Sig Start Date End Date Taking? Authorizing Provider  Ascorbic Acid (VITAMIN C) 1000 MG tablet Take 1,000 mg by mouth daily.    Historical Provider, MD  atorvastatin (LIPITOR) 20 MG tablet Take 1 tablet (20 mg total) by mouth daily. 12/29/13   Runell GessJonathan J Berry, MD  benazepril-hydrochlorthiazide (LOTENSIN HCT) 20-12.5 MG per tablet TAKE 1 TABLET DAILY 05/01/13   Gordy SaversPeter F Kwiatkowski, MD  cefpodoxime Varney Baas(VANTIN) 200 MG tablet  04/21/13   Historical Provider, MD  cetirizine (ZYRTEC) 10 MG tablet Take 10 mg by mouth daily.    Historical Provider, MD  chlorhexidine (PERIDEX) 0.12 % solution Use as  directed 15 mLs in the mouth or throat 2 (two) times daily.  03/18/12   Historical Provider, MD  diclofenac (FLECTOR) 1.3 % PTCH Apply one-half to one patch to each knee every 12 hours. 01/06/14   Lattie HawStephen A Sacha Radloff, MD  diclofenac (VOLTAREN) 75 MG EC tablet Take 75 mg by mouth 2 (two) times daily. 04/07/13   Historical Provider, MD  diclofenac sodium (VOLTAREN) 1 % GEL Apply 1 application topically 3 (three) times daily. Apply 1 to 2 gm each application 01/06/14   Lattie HawStephen A Mallorie Norrod, MD  DRYSOL 20 % external solution Apply 1 application topically every other day. 05/20/12   Historical Provider, MD  EPIPEN 2-PAK 0.3 MG/0.3ML SOAJ Inject 0.3 mg into the skin as needed. 05/13/12   Historical Provider, MD  fluticasone (FLONASE) 50 MCG/ACT nasal spray Place 1 spray into both nostrils daily. 05/07/13    Gordy SaversPeter F Kwiatkowski, MD  Ginkgo Biloba 40 MG TABS Take 120 mg by mouth daily.    Historical Provider, MD  Glucos-MSM-C-Mn-Ginger-Willow (GLUCOSAMINE MSM COMPLEX PO) Take 1,500 mg by mouth 2 (two) times daily.    Historical Provider, MD  HYDROcodone-acetaminophen (NORCO/VICODIN) 5-325 MG per tablet Take one tab by mouth at bedtime as needed for pain 01/06/14   Lattie HawStephen A Alayiah Fontes, MD  niacin (SLO-NIACIN) 500 MG tablet Take 500 mg by mouth every other day.    Historical Provider, MD  pantoprazole (PROTONIX) 40 MG tablet TAKE 1 TABLET DAILY 04/30/13   Gordy SaversPeter F Kwiatkowski, MD  phentermine 37.5 MG capsule Take 1 capsule (37.5 mg total) by mouth every morning. 05/07/13   Gordy SaversPeter F Kwiatkowski, MD   BP 138/86 mmHg  Pulse 76  Temp(Src) 98 F (36.7 C) (Oral)  Resp 18  Ht 5\' 5"  (1.651 m)  Wt 206 lb (93.441 kg)  BMI 34.28 kg/m2  SpO2 98%  LMP 12/07/2013 Physical Exam  Constitutional: She is oriented to person, place, and time. She appears well-developed and well-nourished. No distress.  Patient is obese (BMI 34.3)  HENT:  Head: Normocephalic.  Eyes: Pupils are equal, round, and reactive to light.  Musculoskeletal:       Right elbow: She exhibits normal range of motion and no swelling. Tenderness found. Medial epicondyle and lateral epicondyle tenderness noted.       Left elbow: She exhibits normal range of motion, no swelling and no deformity. Tenderness found. Medial epicondyle and lateral epicondyle tenderness noted.       Right knee: She exhibits normal range of motion, no swelling, no effusion, no ecchymosis, no deformity, no erythema, no LCL laxity, normal patellar mobility and normal meniscus. Tenderness found. Medial joint line tenderness noted. No lateral joint line tenderness noted.       Left knee: She exhibits normal range of motion, no swelling, no effusion, no ecchymosis, no deformity, no erythema, normal alignment, no LCL laxity, normal patellar mobility and normal meniscus. Tenderness found.  Medial joint line tenderness noted. No lateral joint line tenderness noted.       Arms:      Legs: There is distinct tenderness over the medial and lateral epicondyles bilaterally.     Both knees have distinct tenderness along medial joint lines.    Neurological: She is alert and oriented to person, place, and time.  Skin: Skin is warm and dry. No rash noted.  Nursing note and vitals reviewed.   ED Course  Procedures  none     MDM   1. Bilateral medial epicondylitis of elbow joint  2. Lateral epicondylitis of both elbows   3. Primary osteoarthritis of both knees    Dispensed hinged knee braces and tennis elbow bands.  Begin Flector patch to knees, and Voltaren gel to elbows.  Lortab for pain at night.  Begin elbow exercises. Followup with orthopedist.    Lattie Haw, MD 01/08/14 [phone]

## 2014-01-18 ENCOUNTER — Telehealth: Payer: Self-pay | Admitting: *Deleted

## 2014-05-23 ENCOUNTER — Other Ambulatory Visit: Payer: Self-pay | Admitting: Internal Medicine

## 2014-08-06 ENCOUNTER — Ambulatory Visit (INDEPENDENT_AMBULATORY_CARE_PROVIDER_SITE_OTHER): Payer: BLUE CROSS/BLUE SHIELD | Admitting: Cardiovascular Disease

## 2014-08-06 ENCOUNTER — Encounter: Payer: Self-pay | Admitting: Cardiovascular Disease

## 2014-08-06 VITALS — BP 142/88 | HR 79 | Ht 64.0 in | Wt 209.0 lb

## 2014-08-06 DIAGNOSIS — I10 Essential (primary) hypertension: Secondary | ICD-10-CM

## 2014-08-06 DIAGNOSIS — E785 Hyperlipidemia, unspecified: Secondary | ICD-10-CM

## 2014-08-06 DIAGNOSIS — G4733 Obstructive sleep apnea (adult) (pediatric): Secondary | ICD-10-CM | POA: Diagnosis not present

## 2014-08-06 LAB — HEPATIC FUNCTION PANEL
ALBUMIN: 4.2 g/dL (ref 3.6–5.1)
ALK PHOS: 48 U/L (ref 33–130)
ALT: 20 U/L (ref 6–29)
AST: 19 U/L (ref 10–35)
BILIRUBIN DIRECT: 0.1 mg/dL (ref <=0.2)
Indirect Bilirubin: 0.3 mg/dL (ref 0.2–1.2)
TOTAL PROTEIN: 6.7 g/dL (ref 6.1–8.1)
Total Bilirubin: 0.4 mg/dL (ref 0.2–1.2)

## 2014-08-06 LAB — LIPID PANEL
CHOL/HDL RATIO: 3.8 ratio (ref <=5.0)
Cholesterol: 137 mg/dL (ref 125–200)
HDL: 36 mg/dL — ABNORMAL LOW (ref >=46)
LDL Cholesterol: 88 mg/dL (ref <130)
TRIGLYCERIDES: 67 mg/dL (ref <150)
VLDL: 13 mg/dL (ref <30)

## 2014-08-06 MED ORDER — BENAZEPRIL-HYDROCHLOROTHIAZIDE 20-12.5 MG PO TABS: 1.0000 | ORAL_TABLET | Freq: Every day | ORAL | Status: DC

## 2014-08-06 MED ORDER — PANTOPRAZOLE SODIUM 40 MG PO TBEC: 40.0000 mg | DELAYED_RELEASE_TABLET | Freq: Every day | ORAL | Status: DC

## 2014-08-06 MED ORDER — ATORVASTATIN CALCIUM 20 MG PO TABS: 20.0000 mg | ORAL_TABLET | Freq: Every day | ORAL | Status: DC

## 2014-08-06 NOTE — Assessment & Plan Note (Signed)
History of obstructive sleep apnea on CPAP which she benefits from 

## 2014-08-06 NOTE — Assessment & Plan Note (Signed)
History of hypertension with blood pressure measured today at 142/88. She is on benazepril and hydrochlorothiazide. Continue current meds at current dosing

## 2014-08-06 NOTE — Assessment & Plan Note (Signed)
History of hyperlipidemia on atorvastatin. We will recheck a lipid and liver profile 

## 2014-08-06 NOTE — Patient Instructions (Signed)
Your physician wants you to follow-up in: 1 Year. You will receive a reminder letter in the mail two months in advance. If you don't receive a letter, please call our office to schedule the follow-up appointment.  Your physician recommends that you return for lab work in: Fasting Lipids and Liver

## 2014-08-06 NOTE — Progress Notes (Signed)
08/06/2014 Jessica Glass   12-09-1965  [id number]  Primary Physician Rogelia Boga, MD Primary Cardiologist: Runell Gess MD [name]   HPI:  Jessica Glass is a 49 year old moderately overweight single Caucasian female the children while I saw 11/24/09. She works as a Hospital doctor for [employer]. Her factors include remote tobacco abuse, hyperlipidemia and hypertension. She denies chest pain or shortness of breath. She did see me for tachypalpitations. A 2-D echo was normal as were thyroid function tests. Her symptoms are somewhat improved over last several years. She also complains of symptoms compatible type of sleep apnea. As a result of lab work which showed elevated cholesterol with an LDL of 149 I began her on atorvastatin. We will recheck a lipid and liver profile. She did have a sleep study which was abnormal which led to a prescription for CPAP which he wears and benefits from. ssince I saw her a year ago she denies chest pain or shortness of breath.   Current Outpatient Prescriptions  Medication Sig Dispense Refill  . Ascorbic Acid (VITAMIN C) 1000 MG tablet Take 1,000 mg by mouth daily.    Marland Kitchen atorvastatin (LIPITOR) 20 MG tablet Take 1 tablet (20 mg total) by mouth daily. 90 tablet 3  . benazepril-hydrochlorthiazide (LOTENSIN HCT) 20-12.5 MG per tablet Take 1 tablet by mouth daily. 90 tablet 3  . cetirizine (ZYRTEC) 10 MG tablet Take 10 mg by mouth daily.    . chlorhexidine (PERIDEX) 0.12 % solution Use as directed 15 mLs in the mouth or throat 2 (two) times daily.     . diclofenac (FLECTOR) 1.3 % PTCH Apply one-half to one patch to each knee every 12 hours. 30 patch 1  . diclofenac (VOLTAREN) 75 MG EC tablet Take 75 mg by mouth 2 (two) times daily.    . diclofenac sodium (VOLTAREN) 1 % GEL Apply 1 application topically 3 (three) times daily. Apply 1 to 2 gm each application 100 g 1  . DRYSOL 20 % external solution Apply 1 application topically every other day.      Marland Kitchen EPIPEN 2-PAK 0.3 MG/0.3ML SOAJ Inject 0.3 mg into the skin as needed.    . fluticasone (FLONASE) 50 MCG/ACT nasal spray Place 1 spray into both nostrils daily. 48 g 3  . Ginkgo Biloba 40 MG TABS Take 120 mg by mouth daily.    [name] (GLUCOSAMINE MSM COMPLEX PO) Take 1,500 mg by mouth 2 (two) times daily.    . pantoprazole (PROTONIX) 40 MG tablet Take 1 tablet (40 mg total) by mouth daily. 90 tablet 3   No current facility-administered medications for this visit.    No Known Allergies  History   Social History  . Marital Status: Single    Spouse Name: N/A  . Number of Children: N/A  . Years of Education: N/A   Occupational History  . Not on file.   Social History Main Topics  . Smoking status: Former Smoker -- 0.25 packs/day    Types: Cigarettes    Quit date: 06/06/2007  . Smokeless tobacco: Never Used  . Alcohol Use: No  . Drug Use: No  . Sexual Activity: Not on file   Other Topics Concern  . Not on file   Social History Narrative     Review of Systems: General: negative for chills, fever, night sweats or weight changes.  Cardiovascular: negative for chest pain, dyspnea on exertion, edema, orthopnea, palpitations, paroxysmal nocturnal dyspnea or shortness of breath Dermatological: negative for  rash Respiratory: negative for cough or wheezing Urologic: negative for hematuria Abdominal: negative for nausea, vomiting, diarrhea, bright red blood per rectum, melena, or hematemesis Neurologic: negative for visual changes, syncope, or dizziness All other systems reviewed and are otherwise negative except as noted above.    Blood pressure 142/88, pulse 79, height 5\' 4"  (1.626 m), weight 209 lb (94.802 kg).  General appearance: alert and no distress Neck: no adenopathy, no carotid bruit, no JVD, supple, symmetrical, trachea midline and thyroid not enlarged, symmetric, no tenderness/mass/nodules Lungs: clear to auscultation bilaterally Heart:  regular rate and rhythm, S1, S2 normal, no murmur, click, rub or gallop Extremities: extremities normal, atraumatic, no cyanosis or edema  EKG normal sinus rhythm 79 without ST or T-wave changes. I personally reviewed this EKG  ASSESSMENT AND PLAN:   Obstructive sleep apnea History of obstructive sleep apnea on CPAP which she benefits from  Hypertension History of hypertension with blood pressure measured today at 142/88. She is on benazepril and hydrochlorothiazide. Continue current meds at current dosing  Hyperlipidemia History of hyperlipidemia on atorvastatin. We will recheck a lipid and liver profile      Runell Gess MD [facility], [facility] 08/06/2014 8:51 AM

## 2014-08-10 ENCOUNTER — Encounter: Payer: Self-pay | Admitting: *Deleted

## 2014-08-11 ENCOUNTER — Other Ambulatory Visit: Payer: Self-pay | Admitting: Obstetrics and Gynecology

## 2014-08-11 DIAGNOSIS — Z1231 Encounter for screening mammogram for malignant neoplasm of breast: Secondary | ICD-10-CM

## 2014-08-12 ENCOUNTER — Ambulatory Visit (INDEPENDENT_AMBULATORY_CARE_PROVIDER_SITE_OTHER): Payer: BLUE CROSS/BLUE SHIELD

## 2014-08-12 DIAGNOSIS — Z1231 Encounter for screening mammogram for malignant neoplasm of breast: Secondary | ICD-10-CM | POA: Diagnosis not present

## 2014-09-22 ENCOUNTER — Emergency Department (INDEPENDENT_AMBULATORY_CARE_PROVIDER_SITE_OTHER)
Admission: EM | Admit: 2014-09-22 | Discharge: 2014-09-22 | Disposition: A | Discharge status: Home / Self Care | Payer: BLUE CROSS/BLUE SHIELD | Source: Home / Self Care | Attending: Family Medicine | Admitting: Family Medicine

## 2014-09-22 ENCOUNTER — Encounter: Payer: Self-pay | Admitting: Emergency Medicine

## 2014-09-22 ENCOUNTER — Emergency Department (INDEPENDENT_AMBULATORY_CARE_PROVIDER_SITE_OTHER): Payer: BLUE CROSS/BLUE SHIELD

## 2014-09-22 DIAGNOSIS — R05 Cough: Secondary | ICD-10-CM

## 2014-09-22 DIAGNOSIS — J069 Acute upper respiratory infection, unspecified: Secondary | ICD-10-CM

## 2014-09-22 MED ORDER — SALINE SPRAY 0.65 % NA SOLN
1.0000 | NASAL | Status: DC | PRN
Start: 2014-09-22 — End: 2016-11-11

## 2014-09-22 MED ORDER — PSEUDOEPHEDRINE HCL 60 MG PO TABS: 60.0000 mg | ORAL_TABLET | Freq: Four times a day (QID) | ORAL | Status: DC | PRN

## 2014-09-22 MED ORDER — GUAIFENESIN ER 600 MG PO TB12
600.0000 mg | ORAL_TABLET | Freq: Two times a day (BID) | ORAL | Status: DC
Start: 2014-09-22 — End: 2015-08-03

## 2014-09-22 MED ORDER — OXYMETAZOLINE HCL 0.05 % NA SOLN: 1.0000 | Freq: Two times a day (BID) | NASAL | Status: DC

## 2014-09-22 NOTE — ED Provider Notes (Signed)
CSN: [id number]     Arrival date & time 09/22/14  1152 History   First MD Initiated Contact with Patient 09/22/14 1159     Chief Complaint  Patient presents with  . Cough   (Consider location/radiation/quality/duration/timing/severity/associated sxs/prior Treatment) HPI  Pt is a 49yo female presenting to [facility] with c/o productive cough, subjective fever, rhinorrhea, sore throat, watery eyes, and bilateral ear pain for 3-4 days.  Pt states symptoms have gradually worsened.  Cough is moderate intermittent.  Ear pain is aching and sore, moderate in severity. Minimal improvement with acetaminophen and ibuprofen. No sick contacts. Pt did travel to [place] for a cruise about 1 month ago.  Denies hx of asthma but states she has needed and inhaler before when she gets sick and would like one today.  Past Medical History  Diagnosis Date  . Arrhythmia 05/26/09    ECHO ALL CHAMBERS  ARE NORMAL IN SIZE AND FUNCTION EF70%  . Hypertension   . Hyperlipidemia   . GERD (gastroesophageal reflux disease)   . Seasonal allergies   . Obstructive sleep apnea     currently wearing CPAP   Past Surgical History  Procedure Laterality Date  . Tonsillectomy    . Adenoidectomy    . Debridement tennis elbow    . Patella reconstruction Left   . Bladder suspension     Family History  Problem Relation Age of Onset  . Cancer Mother     ovarian  . Hypertension Mother   . Diabetes Mother   . Heart disease Mother   . Hyperlipidemia Father   . Hypertension Father   . Cancer Maternal Aunt     breast   Social History  Substance Use Topics  . Smoking status: Former Smoker -- 0.25 packs/day    Types: Cigarettes    Quit date: 06/06/2007  . Smokeless tobacco: Never Used  . Alcohol Use: No   OB History    No data available     Review of Systems  Constitutional: Positive for fever (subjective). Negative for chills.  HENT: Positive for congestion, ear pain, rhinorrhea, sore throat and voice change. Negative for  sinus pressure and trouble swallowing.   Respiratory: Positive for cough. Negative for shortness of breath.   Cardiovascular: Negative for chest pain and palpitations.  Gastrointestinal: Negative for nausea, vomiting, abdominal pain and diarrhea.  Musculoskeletal: Positive for myalgias and arthralgias. Negative for back pain.  Skin: Negative for rash.  Neurological: Positive for headaches. Negative for dizziness and light-headedness.  All other systems reviewed and are negative.   Allergies  Review of patient's allergies indicates no known allergies.  Home Medications   Prior to Admission medications   Medication Sig Start Date End Date Taking? Authorizing Provider  Ascorbic Acid (VITAMIN C) 1000 MG tablet Take 1,000 mg by mouth daily.    Historical Provider, MD  atorvastatin (LIPITOR) 20 MG tablet Take 1 tablet (20 mg total) by mouth daily. 08/06/14   Runell Gess, MD  benazepril-hydrochlorthiazide (LOTENSIN HCT) 20-12.5 MG per tablet Take 1 tablet by mouth daily. 08/06/14   Runell Gess, MD  cetirizine (ZYRTEC) 10 MG tablet Take 10 mg by mouth daily.    Historical Provider, MD  chlorhexidine (PERIDEX) 0.12 % solution Use as directed 15 mLs in the mouth or throat 2 (two) times daily.  03/18/12   Historical Provider, MD  diclofenac (FLECTOR) 1.3 % PTCH Apply one-half to one patch to each knee every 12 hours. 01/06/14   Lattie Haw, MD  diclofenac (VOLTAREN) 75 MG EC tablet Take 75 mg by mouth 2 (two) times daily. 04/07/13   Historical Provider, MD  diclofenac sodium (VOLTAREN) 1 % GEL Apply 1 application topically 3 (three) times daily. Apply 1 to 2 gm each application 01/06/14   Lattie Haw, MD  DRYSOL 20 % external solution Apply 1 application topically every other day. 05/20/12   Historical Provider, MD  EPIPEN 2-PAK 0.3 MG/0.3ML SOAJ Inject 0.3 mg into the skin as needed. 05/13/12   Historical Provider, MD  fluticasone (FLONASE) 50 MCG/ACT nasal spray Place 1 spray into both  nostrils daily. 05/07/13   Gordy Savers, MD  Ginkgo Biloba 40 MG TABS Take 120 mg by mouth daily.    Historical Provider, MD  Glucos-MSM-C-Mn-Ginger-Willow (GLUCOSAMINE MSM COMPLEX PO) Take 1,500 mg by mouth 2 (two) times daily.    Historical Provider, MD  guaiFENesin (MUCINEX) 600 MG 12 hr tablet Take 1 tablet (600 mg total) by mouth 2 (two) times daily. 09/22/14   Junius Finner, PA-C  oxymetazoline (AFRIN NASAL SPRAY) 0.05 % nasal spray Place 1 spray into both nostrils 2 (two) times daily. No longer than 4 consecutive days 09/22/14   Junius Finner, PA-C  pantoprazole (PROTONIX) 40 MG tablet Take 1 tablet (40 mg total) by mouth daily. 08/06/14   Runell Gess, MD  pseudoephedrine (SUDAFED) 60 MG tablet Take 1 tablet (60 mg total) by mouth every 6 (six) hours as needed for congestion. 09/22/14   Junius Finner, PA-C  sodium chloride (OCEAN) 0.65 % SOLN nasal spray Place 1 spray into both nostrils as needed. 09/22/14   Junius Finner, PA-C   Meds Ordered and Administered this Visit  Medications - No data to display  BP 139/84 mmHg  Pulse 97  Temp(Src) 98.2 F (36.8 C) (Oral)  Ht 5\' 4"  (1.626 m)  Wt 207 lb (93.895 kg)  BMI 35.51 kg/m2  SpO2 96%  LMP 09/22/2014 No data found.   Physical Exam  Constitutional: She appears well-developed and well-nourished. No distress.  HENT:  Head: Normocephalic and atraumatic.  Right Ear: Hearing, tympanic membrane, external ear and ear canal normal.  Left Ear: Hearing, tympanic membrane, external ear and ear canal normal.  Nose: Mucosal edema present. Right sinus exhibits no maxillary sinus tenderness and no frontal sinus tenderness. Left sinus exhibits no maxillary sinus tenderness and no frontal sinus tenderness.  Mouth/Throat: Uvula is midline, oropharynx is clear and moist and mucous membranes are normal.  Eyes: Conjunctivae are normal. No scleral icterus.  Neck: Normal range of motion. Neck supple.  Cardiovascular: Normal rate, regular rhythm  and normal heart sounds.   Pulmonary/Chest: Effort normal. No respiratory distress. She has wheezes (faint expiratory wheeze in lower lung field). She has no rales. She exhibits no tenderness.  Abdominal: Soft. Bowel sounds are normal. She exhibits no distension and no mass. There is no tenderness. There is no rebound and no guarding.  Musculoskeletal: Normal range of motion.  Neurological: She is alert.  Skin: Skin is warm and dry. She is not diaphoretic.  Nursing note and vitals reviewed.   ED Course  Procedures (including critical care time)  Labs Review Labs Reviewed - No data to display  Imaging Review Dg Chest 2 View  09/22/2014   CLINICAL DATA:  Productive cough for 1 week. Hypertension. Former smoker.  EXAM: CHEST  2 VIEW  COMPARISON:  None.  FINDINGS: The heart size and mediastinal contours are within normal limits. Both lungs are clear. The visualized skeletal structures  are unremarkable.  IMPRESSION: No active cardiopulmonary disease.   Electronically Signed   By: Charlett Nose M.D.   On: 09/22/2014 12:30      MDM   1. Acute upper respiratory infection    URI No pneumonia on CXR Symptoms likely viral in nature. Will tx symptomatically Rx: albuterol inhaler, afrin, mucinex during the day, sudafed at night. Advised parents to use acetaminophen and ibuprofen as needed for fever and pain. Encouraged rest and fluids. F/u with PCP in 1 week if not improving.  Work note for today and tomorrow provided. Pt verbalized understanding and agreement with tx plan.     Junius Finner, PA-C 09/22/14 1259

## 2014-09-22 NOTE — ED Notes (Signed)
Productive cough, fever, runny nose, scratchy throat, watery eyes, ears hurt x 3-4 days

## 2015-07-25 ENCOUNTER — Other Ambulatory Visit: Payer: Self-pay | Admitting: Obstetrics and Gynecology

## 2015-07-25 DIAGNOSIS — Z139 Encounter for screening, unspecified: Secondary | ICD-10-CM

## 2015-08-03 ENCOUNTER — Ambulatory Visit (INDEPENDENT_AMBULATORY_CARE_PROVIDER_SITE_OTHER): Payer: BLUE CROSS/BLUE SHIELD

## 2015-08-03 ENCOUNTER — Ambulatory Visit (INDEPENDENT_AMBULATORY_CARE_PROVIDER_SITE_OTHER): Payer: BLUE CROSS/BLUE SHIELD | Admitting: Cardiovascular Disease

## 2015-08-03 ENCOUNTER — Encounter: Payer: Self-pay | Admitting: Cardiovascular Disease

## 2015-08-03 VITALS — BP 122/80 | HR 75 | Ht 64.0 in | Wt 217.6 lb

## 2015-08-03 DIAGNOSIS — Z1231 Encounter for screening mammogram for malignant neoplasm of breast: Secondary | ICD-10-CM

## 2015-08-03 DIAGNOSIS — G4733 Obstructive sleep apnea (adult) (pediatric): Secondary | ICD-10-CM

## 2015-08-03 DIAGNOSIS — Z139 Encounter for screening, unspecified: Secondary | ICD-10-CM

## 2015-08-03 DIAGNOSIS — I10 Essential (primary) hypertension: Secondary | ICD-10-CM

## 2015-08-03 DIAGNOSIS — Z79899 Other long term (current) drug therapy: Secondary | ICD-10-CM

## 2015-08-03 DIAGNOSIS — E785 Hyperlipidemia, unspecified: Secondary | ICD-10-CM | POA: Diagnosis not present

## 2015-08-03 NOTE — Assessment & Plan Note (Signed)
History of hyperlipidemia on atorvastatin with recent lipid profile performed one year ago revealing total cholesterol 137, LDL 88 HDL of 36

## 2015-08-03 NOTE — Assessment & Plan Note (Signed)
History of obstructive sleep apnea on CPAP which she benefits from 

## 2015-08-03 NOTE — Patient Instructions (Signed)
Medication Instructions:  Your physician recommends that you continue on your current medications as directed. Please refer to the Current Medication list given to you today.   Labwork: Your physician recommends that you return for lab work AT YOUR EARLIEST CONVENIENCE- YOU WILL NEED TO BE FASTING. The lab can be found on the FIRST FLOOR of out building in [address]   Follow-Up: Your physician recommends that you schedule a follow-up appointment ON AN AS NEEDED BASIS.   If you need a refill on your cardiac medications before your next appointment, please call your pharmacy.

## 2015-08-03 NOTE — Progress Notes (Signed)
08/03/2015 Jessica Glass   July 20, 1965  [id number]  Primary Physician Rogelia Boga, MD Primary Cardiologist: Runell Gess MD [name]  HPI:  Jessica Glass is a 50 year old moderately overweight single Caucasian female the children while I saw 08/06/14. She works as a Hospital doctor for [employer]. Her factors include remote tobacco abuse, hyperlipidemia and hypertension. She denies chest pain or shortness of breath. She did see me for tachypalpitations. A 2-D echo was normal as were thyroid function tests. Her symptoms are somewhat improved over last several years. She also complains of symptoms compatible type of sleep apnea. As a result of lab work which showed elevated cholesterol with an LDL of 149 I began her on atorvastatin. We will recheck a lipid and liver profile. She did have a sleep study which was abnormal which led to a prescription for CPAP which he wears and benefits from. Since I saw her a year ago she denies chest pain or shortness of breath.   Current Outpatient Prescriptions  Medication Sig Dispense Refill  . Ascorbic Acid (VITAMIN C) 1000 MG tablet Take 1,000 mg by mouth daily.    Marland Kitchen atorvastatin (LIPITOR) 20 MG tablet Take 1 tablet (20 mg total) by mouth daily. 90 tablet 3  . benazepril-hydrochlorthiazide (LOTENSIN HCT) 20-12.5 MG per tablet Take 1 tablet by mouth daily. 90 tablet 3  . cetirizine (ZYRTEC) 10 MG tablet Take 10 mg by mouth daily.    . chlorhexidine (PERIDEX) 0.12 % solution Use as directed 15 mLs in the mouth or throat 2 (two) times daily.     . diclofenac (VOLTAREN) 75 MG EC tablet Take 75 mg by mouth 2 (two) times daily.    . diclofenac sodium (VOLTAREN) 1 % GEL Apply 1 application topically 3 (three) times daily. Apply 1 to 2 gm each application 100 g 1  . EPIPEN 2-PAK 0.3 MG/0.3ML SOAJ Inject 0.3 mg into the skin as needed.    . fluticasone (FLONASE) 50 MCG/ACT nasal spray Place 1 spray into both nostrils daily. 48 g 3  . Ginkgo Biloba 40  MG TABS Take 120 mg by mouth daily.    [name] (GLUCOSAMINE MSM COMPLEX PO) Take 1,500 mg by mouth 2 (two) times daily.    . pantoprazole (PROTONIX) 40 MG tablet Take 1 tablet (40 mg total) by mouth daily. 90 tablet 3  . sodium chloride (OCEAN) 0.65 % SOLN nasal spray Place 1 spray into both nostrils as needed. 1 Bottle 0   No current facility-administered medications for this visit.     No Known Allergies  Social History   Social History  . Marital status: Single    Spouse name: N/A  . Number of children: N/A  . Years of education: N/A   Occupational History  . Not on file.   Social History Main Topics  . Smoking status: Former Smoker    Packs/day: 0.25    Types: Cigarettes    Quit date: 06/06/2007  . Smokeless tobacco: Never Used  . Alcohol use No  . Drug use: No  . Sexual activity: Not on file   Other Topics Concern  . Not on file   Social History Narrative  . No narrative on file     Review of Systems: General: negative for chills, fever, night sweats or weight changes.  Cardiovascular: negative for chest pain, dyspnea on exertion, edema, orthopnea, palpitations, paroxysmal nocturnal dyspnea or shortness of breath Dermatological: negative for rash Respiratory: negative for cough or  wheezing Urologic: negative for hematuria Abdominal: negative for nausea, vomiting, diarrhea, bright red blood per rectum, melena, or hematemesis Neurologic: negative for visual changes, syncope, or dizziness All other systems reviewed and are otherwise negative except as noted above.    Blood pressure 122/80, pulse 75, height 5\' 4"  (1.626 m), weight 217 lb 9.6 oz (98.7 kg).  General appearance: alert and no distress Neck: no adenopathy, no carotid bruit, no JVD, supple, symmetrical, trachea midline and thyroid not enlarged, symmetric, no tenderness/mass/nodules Lungs: clear to auscultation bilaterally Heart: regular rate and rhythm, S1, S2 normal, no murmur,  click, rub or gallop Extremities: extremities normal, atraumatic, no cyanosis or edema  EKG normal sinus rhythm at 75 without ST or T-wave changes. I personally reviewed this EKG  ASSESSMENT AND PLAN:   Essential hypertension History of hypertension blood pressure measured today at 122/80. She is on Lotensin and hydrochlorothiazide. Continue current meds at current dosing  Hyperlipidemia History of hyperlipidemia on atorvastatin with recent lipid profile performed one year ago revealing total cholesterol 137, LDL 88 HDL of 36  Obstructive sleep apnea History of obstructive sleep apnea on CPAP which she benefits from.      Runell Gess MD FACP,FACC,FAHA, [facility] 08/03/2015 11:05 AM

## 2015-08-03 NOTE — Assessment & Plan Note (Signed)
History of hypertension blood pressure measured today at 122/80. She is on Lotensin and hydrochlorothiazide. Continue current meds at current dosing

## 2015-08-11 ENCOUNTER — Encounter: Payer: Self-pay | Admitting: *Deleted

## 2015-08-11 LAB — HEPATIC FUNCTION PANEL
ALK PHOS: 53 U/L (ref 33–130)
ALT: 21 U/L (ref 6–29)
AST: 21 U/L (ref 10–35)
Albumin: 3.9 g/dL (ref 3.6–5.1)
BILIRUBIN DIRECT: 0.1 mg/dL (ref <=0.2)
BILIRUBIN INDIRECT: 0.5 mg/dL (ref 0.2–1.2)
BILIRUBIN TOTAL: 0.6 mg/dL (ref 0.2–1.2)
Total Protein: 6.5 g/dL (ref 6.1–8.1)

## 2015-08-11 LAB — LIPID PANEL
CHOL/HDL RATIO: 3.3 ratio (ref <=5.0)
Cholesterol: 127 mg/dL (ref 125–200)
HDL: 38 mg/dL — AB (ref >=46)
LDL CALC: 69 mg/dL (ref <130)
Triglycerides: 100 mg/dL (ref <150)
VLDL: 20 mg/dL (ref <30)

## 2015-08-22 ENCOUNTER — Other Ambulatory Visit: Payer: Self-pay | Admitting: Cardiovascular Disease

## 2015-08-26 ENCOUNTER — Ambulatory Visit: Payer: BLUE CROSS/BLUE SHIELD | Admitting: Cardiovascular Disease

## 2015-09-28 ENCOUNTER — Emergency Department (INDEPENDENT_AMBULATORY_CARE_PROVIDER_SITE_OTHER)
Admission: EM | Admit: 2015-09-28 | Discharge: 2015-09-28 | Disposition: A | Discharge status: Home / Self Care | Payer: BLUE CROSS/BLUE SHIELD | Source: Home / Self Care

## 2015-09-28 ENCOUNTER — Encounter: Payer: Self-pay | Admitting: *Deleted

## 2015-09-28 DIAGNOSIS — Z23 Encounter for immunization: Secondary | ICD-10-CM | POA: Diagnosis not present

## 2015-09-28 MED ORDER — INFLUENZA VAC SPLIT QUAD 0.5 ML IM SUSY
0.5000 mL | PREFILLED_SYRINGE | Freq: Once | INTRAMUSCULAR | Status: AC
  Administered 2015-09-28: 0.5 mL via INTRAMUSCULAR

## 2015-09-28 NOTE — ED Triage Notes (Signed)
The pt is here today for an influenza vaccine.   

## 2015-10-12 ENCOUNTER — Encounter: Payer: Self-pay | Admitting: Emergency Medicine

## 2015-10-12 ENCOUNTER — Emergency Department (INDEPENDENT_AMBULATORY_CARE_PROVIDER_SITE_OTHER)
Admission: EM | Admit: 2015-10-12 | Discharge: 2015-10-12 | Disposition: A | Discharge status: Home / Self Care | Payer: BLUE CROSS/BLUE SHIELD | Source: Home / Self Care | Attending: Family Medicine | Admitting: Family Medicine

## 2015-10-12 DIAGNOSIS — H6983 Other specified disorders of Eustachian tube, bilateral: Secondary | ICD-10-CM | POA: Diagnosis not present

## 2015-10-12 MED ORDER — AMOXICILLIN 875 MG PO TABS: 875.0000 mg | ORAL_TABLET | Freq: Two times a day (BID) | ORAL | 0 refills | Status: DC

## 2015-10-12 MED ORDER — PREDNISONE 20 MG PO TABS: ORAL_TABLET | ORAL | 0 refills | Status: DC

## 2015-10-12 NOTE — ED Triage Notes (Signed)
Reports right ear pain past 'couple' days. Also has been stung on both upper arms by yellow jackets and has questions.

## 2015-10-12 NOTE — ED Provider Notes (Signed)
[facility]    CSN: [id number] Arrival date & time: 10/12/15  1725     History   Chief Complaint Chief Complaint  Patient presents with  . Otalgia    right    HPI Jessica Glass is a 50 y.o. female.   Patient presents with two complaints: 1)  She complains of fullness in her right ear for about two days, and has difficulty equalizing pressure in her ears. 2)  She was stung on both upper arms by yellow jackets, and the areas still itch somewhat but redness and pain have resolved.  No shortness of breath or chest tightness.   The history is provided by the patient.    Past Medical History:  Diagnosis Date  . Arrhythmia 05/26/09   ECHO ALL CHAMBERS  ARE NORMAL IN SIZE AND FUNCTION EF70%  . GERD (gastroesophageal reflux disease)   . Hyperlipidemia   . Hypertension   . Obstructive sleep apnea    currently wearing CPAP  . Seasonal allergies     Patient Active Problem List   Diagnosis Date Noted  . Obstructive sleep apnea 04/10/2013  . Hypertension   . Hyperlipidemia   . BACK PAIN 02/28/2007  . GERD 08/21/2006  . Essential hypertension 08/15/2006  . ALLERGIC RHINITIS 08/15/2006    Past Surgical History:  Procedure Laterality Date  . ADENOIDECTOMY    . BLADDER SUSPENSION    . DEBRIDEMENT TENNIS ELBOW    . PATELLA RECONSTRUCTION Left   . TONSILLECTOMY      OB History    No data available       Home Medications    Prior to Admission medications   Medication Sig Start Date End Date Taking? Authorizing Provider  amoxicillin (AMOXIL) 875 MG tablet Take 1 tablet (875 mg total) by mouth 2 (two) times daily. 10/12/15   Lattie Haw, MD  Ascorbic Acid (VITAMIN C) 1000 MG tablet Take 1,000 mg by mouth daily.    Historical Provider, MD  atorvastatin (LIPITOR) 20 MG tablet Take 1 tablet by mouth  daily 08/23/15   Runell Gess, MD  benazepril-hydrochlorthiazide (LOTENSIN HCT) 20-12.5 MG tablet Take 1 tablet by mouth  daily 08/23/15   Runell Gess, MD  cetirizine (ZYRTEC) 10 MG tablet Take 10 mg by mouth daily.    Historical Provider, MD  chlorhexidine (PERIDEX) 0.12 % solution Use as directed 15 mLs in the mouth or throat 2 (two) times daily.  03/18/12   Historical Provider, MD  diclofenac (VOLTAREN) 75 MG EC tablet Take 75 mg by mouth 2 (two) times daily. 04/07/13   Historical Provider, MD  diclofenac sodium (VOLTAREN) 1 % GEL Apply 1 application topically 3 (three) times daily. Apply 1 to 2 gm each application 01/06/14   Lattie Haw, MD  EPIPEN 2-PAK 0.3 MG/0.3ML SOAJ Inject 0.3 mg into the skin as needed. 05/13/12   Historical Provider, MD  fluticasone (FLONASE) 50 MCG/ACT nasal spray Place 1 spray into both nostrils daily. 05/07/13   Gordy Savers, MD  Ginkgo Biloba 40 MG TABS Take 120 mg by mouth daily.    Historical Provider, MD  Glucos-MSM-C-Mn-Ginger-Willow (GLUCOSAMINE MSM COMPLEX PO) Take 1,500 mg by mouth 2 (two) times daily.    Historical Provider, MD  pantoprazole (PROTONIX) 40 MG tablet Take 1 tablet by mouth  daily 08/23/15   Runell Gess, MD  predniSONE (DELTASONE) 20 MG tablet Take one tab by mouth twice daily for 5 days, then one daily. Take  with food. 10/12/15   Lattie Haw, MD  sodium chloride (OCEAN) 0.65 % SOLN nasal spray Place 1 spray into both nostrils as needed. 09/22/14   Junius Finner, PA-C    Family History Family History  Problem Relation Age of Onset  . Cancer Mother     ovarian  . Hypertension Mother   . Diabetes Mother   . Heart disease Mother   . Hyperlipidemia Father   . Hypertension Father   . Cancer Maternal Aunt     breast    Social History Social History  Substance Use Topics  . Smoking status: Former Smoker    Packs/day: 0.25    Types: Cigarettes    Quit date: 06/06/2007  . Smokeless tobacco: Never Used  . Alcohol use No     Allergies   Review of patient's allergies indicates no known allergies.   Review of Systems Review of Systems No sore throat No  cough No pleuritic pain No wheezing No nasal congestion No post-nasal drainage No sinus pain/pressure No itchy/red eyes ? earache No hemoptysis No SOB No fever/chills No nausea No vomiting No abdominal pain No diarrhea No urinary symptoms No skin rash No fatigue No myalgias No headache Used OTC meds without relief   Physical Exam Triage Vital Signs ED Triage Vitals  Enc Vitals Group     BP 10/12/15 1740 123/82     Pulse Rate 10/12/15 1740 95     Resp 10/12/15 1740 16     Temp 10/12/15 1740 98 F (36.7 C)     Temp Source 10/12/15 1740 Oral     SpO2 10/12/15 1740 96 %     Weight 10/12/15 1741 210 lb (95.3 kg)     Height 10/12/15 1741 5\' 5"  (1.651 m)     Head Circumference --      Peak Flow --      Pain Score 10/12/15 1743 1     Pain Loc --      Pain Edu? --      Excl. in GC? --    No data found.   Updated Vital Signs BP 123/82 (BP Location: Left Arm)   Pulse 95   Temp 98 F (36.7 C) (Oral)   Resp 16   Ht 5\' 5"  (1.651 m)   Wt 210 lb (95.3 kg)   SpO2 96%   BMI 34.95 kg/m   Visual Acuity Right Eye Distance:   Left Eye Distance:   Bilateral Distance:    Right Eye Near:   Left Eye Near:    Bilateral Near:     Physical Exam Nursing notes and Vital Signs reviewed. Appearance:  Patient appears stated age, and in no acute distress Eyes:  Pupils are equal, round, and reactive to light and accomodation.  Extraocular movement is intact.  Conjunctivae are not inflamed  Ears:  Canals normal.  Tympanic membranes normal.  No temporomandibular joint tenderness to palpation. Nose:   Normal.  No sinus tenderness.   Pharynx:  Normal Neck:  Supple.  No adenopathy. Lungs:  Clear to auscultation.  Breath sounds are equal.  Moving air well. Heart:  Regular rate and rhythm without murmurs, rubs, or gallops.  Abdomen:  Nontender without masses or hepatosplenomegaly.  Bowel sounds are present.  No CVA or flank tenderness.  Extremities:  No edema.  Skin:  No rash  present.    UC Treatments / Results  Labs (all labs ordered are listed, but only abnormal results are displayed)  Labs Reviewed -  Tympanometry:  Right ear tympanogram low peak height; Left ear tympanogram wide.  EKG  EKG Interpretation None       Radiology No results found.  Procedures Procedures (including critical care time)  Medications Ordered in UC Medications - No data to display   Initial Impression / Assessment and Plan / UC Course  I have reviewed the triage vital signs and the nursing notes.  Pertinent labs & imaging results that were available during my care of the patient were reviewed by me and considered in my medical decision making (see chart for details).  Clinical Course  No erythema, warmth, swelling, or tenderness at site of yellow jacket stings upper arms. ?Otitis media Begin empiric Amoxicillin 875mg  BID and prednisone burst/taper. Continue Flonase nasal spray. Followup with ENT if not improved in one week.      Final Clinical Impressions(s) / UC Diagnoses   Final diagnoses:  Eustachian tube dysfunction, bilateral    New Prescriptions New Prescriptions   AMOXICILLIN (AMOXIL) 875 MG TABLET    Take 1 tablet (875 mg total) by mouth 2 (two) times daily.   PREDNISONE (DELTASONE) 20 MG TABLET    Take one tab by mouth twice daily for 5 days, then one daily. Take with food.     Lattie HawStephen A Almando Brawley, MD 10/15/15 2100

## 2015-10-12 NOTE — Discharge Instructions (Signed)
-   Continue Flonase nasal spray 

## 2015-11-26 ENCOUNTER — Emergency Department (INDEPENDENT_AMBULATORY_CARE_PROVIDER_SITE_OTHER)
Admission: EM | Admit: 2015-11-26 | Discharge: 2015-11-26 | Disposition: A | Discharge status: Home / Self Care | Payer: BLUE CROSS/BLUE SHIELD | Source: Home / Self Care | Attending: Family Medicine | Admitting: Family Medicine

## 2015-11-26 ENCOUNTER — Encounter: Payer: Self-pay | Admitting: Emergency Medicine

## 2015-11-26 DIAGNOSIS — J0100 Acute maxillary sinusitis, unspecified: Secondary | ICD-10-CM

## 2015-11-26 MED ORDER — BENZONATATE 100 MG PO CAPS: 100.0000 mg | ORAL_CAPSULE | Freq: Three times a day (TID) | ORAL | 0 refills | Status: DC

## 2015-11-26 MED ORDER — PREDNISONE 20 MG PO TABS: ORAL_TABLET | ORAL | 0 refills | Status: DC

## 2015-11-26 MED ORDER — AMOXICILLIN-POT CLAVULANATE 875-125 MG PO TABS: 1.0000 | ORAL_TABLET | Freq: Two times a day (BID) | ORAL | 0 refills | Status: DC

## 2015-11-26 NOTE — ED Triage Notes (Signed)
Pt c/o sore throat, productive cough, bilateral ear pain, sinus drainage

## 2015-11-26 NOTE — ED Provider Notes (Signed)
CSN: [id number]     Arrival date & time 11/26/15  1308 History   First MD Initiated Contact with Patient 11/26/15 1347     Chief Complaint  Patient presents with  . URI   (Consider location/radiation/quality/duration/timing/severity/associated sxs/prior Treatment) HPI  Jessica Glass is a [age] y.o. female presenting to UC with c/o sore throat, bilateral ear pain, sinus congestion and mild to moderately productive intermittent cough for about 1 week. Pt's friend accompanying her was dx and treated with antibiotics for a sinus infection last week. Pt has been trying sinus rinses and OTC cold/sinus medications with minimal temporary relief. Denies fever, chills, n/v/d.    Past Medical History:  Diagnosis Date  . Arrhythmia 05/26/09   ECHO ALL CHAMBERS  ARE NORMAL IN SIZE AND FUNCTION EF70%  . GERD (gastroesophageal reflux disease)   . Hyperlipidemia   . Hypertension   . Obstructive sleep apnea    currently wearing CPAP  . Seasonal allergies    Past Surgical History:  Procedure Laterality Date  . ADENOIDECTOMY    . BLADDER SUSPENSION    . DEBRIDEMENT TENNIS ELBOW    . PATELLA RECONSTRUCTION Left   . TONSILLECTOMY     Family History  Problem Relation Age of Onset  . Cancer Mother     ovarian  . Hypertension Mother   . Diabetes Mother   . Heart disease Mother   . Hyperlipidemia Father   . Hypertension Father   . Cancer Maternal Aunt     breast   Social History  Substance Use Topics  . Smoking status: Former Smoker    Packs/day: 0.25    Types: Cigarettes    Quit date: 06/06/2007  . Smokeless tobacco: Never Used  . Alcohol use No   OB History    No data available     Review of Systems  Constitutional: Negative for chills and fever.  HENT: Positive for congestion, ear pain, rhinorrhea, sinus pain, sinus pressure and sore throat. Negative for trouble swallowing and voice change.   Respiratory: Positive for cough. Negative for shortness of breath.   Cardiovascular:  Negative for chest pain and palpitations.  Gastrointestinal: Negative for abdominal pain, diarrhea, nausea and vomiting.  Musculoskeletal: Negative for arthralgias, back pain and myalgias.  Skin: Negative for rash.  Neurological: Positive for headaches. Negative for dizziness and light-headedness.    Allergies  Patient has no known allergies.  Home Medications   Prior to Admission medications   Medication Sig Start Date End Date Taking? Authorizing Provider  amoxicillin (AMOXIL) 875 MG tablet Take 1 tablet (875 mg total) by mouth 2 (two) times daily. 10/12/15   Lattie HawStephen A Beese, MD  amoxicillin-clavulanate (AUGMENTIN) 875-125 MG tablet Take 1 tablet by mouth 2 (two) times daily. One po bid x 7 days 11/26/15   Junius FinnerErin O'Malley, PA-C  Ascorbic Acid (VITAMIN C) 1000 MG tablet Take 1,000 mg by mouth daily.    Historical Provider, MD  atorvastatin (LIPITOR) 20 MG tablet Take 1 tablet by mouth  daily 08/23/15   Runell GessJonathan J Berry, MD  benazepril-hydrochlorthiazide (LOTENSIN HCT) 20-12.5 MG tablet Take 1 tablet by mouth  daily 08/23/15   Runell GessJonathan J Berry, MD  benzonatate (TESSALON) 100 MG capsule Take 1-2 capsules (100-200 mg total) by mouth every 8 (eight) hours. 11/26/15   Junius FinnerErin O'Malley, PA-C  cetirizine (ZYRTEC) 10 MG tablet Take 10 mg by mouth daily.    Historical Provider, MD  chlorhexidine (PERIDEX) 0.12 % solution Use as directed 15 mLs in the  mouth or throat 2 (two) times daily.  03/18/12   Historical Provider, MD  diclofenac (VOLTAREN) 75 MG EC tablet Take 75 mg by mouth 2 (two) times daily. 04/07/13   Historical Provider, MD  diclofenac sodium (VOLTAREN) 1 % GEL Apply 1 application topically 3 (three) times daily. Apply 1 to 2 gm each application 01/06/14   Lattie HawStephen A Beese, MD  EPIPEN 2-PAK 0.3 MG/0.3ML SOAJ Inject 0.3 mg into the skin as needed. 05/13/12   Historical Provider, MD  fluticasone (FLONASE) 50 MCG/ACT nasal spray Place 1 spray into both nostrils daily. 05/07/13   Gordy SaversPeter F Kwiatkowski, MD   Ginkgo Biloba 40 MG TABS Take 120 mg by mouth daily.    Historical Provider, MD  Glucos-MSM-C-Mn-Ginger-Willow (GLUCOSAMINE MSM COMPLEX PO) Take 1,500 mg by mouth 2 (two) times daily.    Historical Provider, MD  pantoprazole (PROTONIX) 40 MG tablet Take 1 tablet by mouth  daily 08/23/15   Runell GessJonathan J Berry, MD  predniSONE (DELTASONE) 20 MG tablet Take one tab by mouth twice daily for 5 days, then one daily. Take with food. 10/12/15   Lattie HawStephen A Beese, MD  predniSONE (DELTASONE) 20 MG tablet 3 tabs po day one, then 2 po daily x 4 days 11/26/15   Junius FinnerErin O'Malley, PA-C  sodium chloride (OCEAN) 0.65 % SOLN nasal spray Place 1 spray into both nostrils as needed. 09/22/14   Junius FinnerErin O'Malley, PA-C   Meds Ordered and Administered this Visit  Medications - No data to display  BP 130/87 (BP Location: Left Arm)   Pulse 92   Temp 98.3 F (36.8 C) (Oral)   Ht 5\' 5"  (1.651 m)   Wt 197 lb (89.4 kg)   SpO2 95%   BMI 32.78 kg/m  No data found.   Physical Exam  Constitutional: She appears well-developed and well-nourished. No distress.  HENT:  Head: Normocephalic and atraumatic.  Right Ear: Tympanic membrane normal.  Left Ear: Tympanic membrane normal.  Nose: Mucosal edema present. Right sinus exhibits maxillary sinus tenderness. Right sinus exhibits no frontal sinus tenderness. Left sinus exhibits maxillary sinus tenderness. Left sinus exhibits no frontal sinus tenderness.  Mouth/Throat: Uvula is midline, oropharynx is clear and moist and mucous membranes are normal.  Eyes: Conjunctivae are normal. No scleral icterus.  Neck: Normal range of motion. Neck supple.  Cardiovascular: Normal rate, regular rhythm and normal heart sounds.   Pulmonary/Chest: Effort normal and breath sounds normal. No stridor. No respiratory distress. She has no wheezes. She has no rales.  Abdominal: Soft. She exhibits no distension. There is no tenderness.  Musculoskeletal: Normal range of motion.  Lymphadenopathy:    She has  cervical adenopathy.  Neurological: She is alert.  Skin: Skin is warm and dry. She is not diaphoretic.  Nursing note and vitals reviewed.   Urgent Care Course   Clinical Course     Procedures (including critical care time)  Labs Review Labs Reviewed - No data to display  Imaging Review No results found.  MDM   1. Acute non-recurrent maxillary sinusitis    Pt c/o 1 week of gradually worsening URI and sinus symptoms. Sick contact.   Sinus tenderness on exam with nasal mucosa edema.  Rx: Augmentin, tessalon and prednisone  Encouraged to continue sinus rinses. F/u with PCP next week if needed.   Junius FinnerErin O'Malley, PA-C 11/26/15 [phone]

## 2016-06-23 ENCOUNTER — Encounter: Payer: Self-pay | Admitting: Emergency Medicine

## 2016-06-23 ENCOUNTER — Emergency Department (INDEPENDENT_AMBULATORY_CARE_PROVIDER_SITE_OTHER)
Admission: EM | Admit: 2016-06-23 | Discharge: 2016-06-23 | Disposition: A | Discharge status: Home / Self Care | Payer: BLUE CROSS/BLUE SHIELD | Source: Home / Self Care | Attending: Family Medicine | Admitting: Family Medicine

## 2016-06-23 DIAGNOSIS — R197 Diarrhea, unspecified: Secondary | ICD-10-CM | POA: Diagnosis not present

## 2016-06-23 DIAGNOSIS — R110 Nausea: Secondary | ICD-10-CM

## 2016-06-23 MED ORDER — ONDANSETRON 4 MG PO TBDP
4.0000 mg | ORAL_TABLET | Freq: Once | ORAL | Status: AC
  Administered 2016-06-23: 4 mg via ORAL

## 2016-06-23 MED ORDER — DICYCLOMINE HCL 20 MG PO TABS: 20.0000 mg | ORAL_TABLET | Freq: Two times a day (BID) | ORAL | 0 refills | Status: DC

## 2016-06-23 MED ORDER — CIPROFLOXACIN HCL 500 MG PO TABS: 500.0000 mg | ORAL_TABLET | Freq: Two times a day (BID) | ORAL | 0 refills | Status: DC

## 2016-06-23 MED ORDER — ONDANSETRON 4 MG PO TBDP: ORAL_TABLET | ORAL | 0 refills | Status: DC

## 2016-06-23 MED ORDER — METRONIDAZOLE 500 MG PO TABS: 500.0000 mg | ORAL_TABLET | Freq: Two times a day (BID) | ORAL | 0 refills | Status: DC

## 2016-06-23 NOTE — ED Triage Notes (Signed)
Pt c/o diarrhea that started last night. Watery and many episodes. minimal relief with imodium and kaopectate.

## 2016-06-23 NOTE — ED Provider Notes (Signed)
CSN: [id number]     Arrival date & time 06/23/16  1609 History   First MD Initiated Contact with Patient 06/23/16 1635     Chief Complaint  Patient presents with  . Diarrhea   (Consider location/radiation/quality/duration/timing/severity/associated sxs/prior Treatment) HPI  Jessica Glass is a 51 y.o. female presenting to UC with c/o watery diarrhea that started initially 2-3 days ago with loose stools but became watery last night.  She has had over 15-20 episodes of watery diarrhea in the last 24 hours, associated nausea but no vomiting.  Denies fever or chills. Mild diffuse abdominal cramping.  No blood or mucous in stool. She cannot recall anything she has eaten that could have caused these symptoms. Denies eating meat, dairy, or lettuce recently. No one else around her has been sick. No recent travel.  She has taken imodium and kaopectate w/o relief. Denies fever or chills. No urinary symptoms.    Past Medical History:  Diagnosis Date  . Arrhythmia 05/26/09   ECHO ALL CHAMBERS  ARE NORMAL IN SIZE AND FUNCTION EF70%  . GERD (gastroesophageal reflux disease)   . Hyperlipidemia   . Hypertension   . Obstructive sleep apnea    currently wearing CPAP  . Seasonal allergies    Past Surgical History:  Procedure Laterality Date  . ADENOIDECTOMY    . BLADDER SUSPENSION    . DEBRIDEMENT TENNIS ELBOW    . PATELLA RECONSTRUCTION Left   . TONSILLECTOMY     Family History  Problem Relation Age of Onset  . Cancer Mother        ovarian  . Hypertension Mother   . Diabetes Mother   . Heart disease Mother   . Hyperlipidemia Father   . Hypertension Father   . Cancer Maternal Aunt        breast   Social History  Substance Use Topics  . Smoking status: Former Smoker    Packs/day: 0.25    Types: Cigarettes    Quit date: 06/06/2007  . Smokeless tobacco: Never Used  . Alcohol use No   OB History    No data available     Review of Systems  Constitutional: Negative for chills and fever.   HENT: Negative for congestion, ear pain, sore throat, trouble swallowing and voice change.   Respiratory: Negative for cough and shortness of breath.   Cardiovascular: Negative for chest pain and palpitations.  Gastrointestinal: Positive for abdominal pain (mild diffuse cramping), diarrhea and nausea. Negative for vomiting.  Genitourinary: Negative for dysuria, flank pain and frequency.  Musculoskeletal: Negative for arthralgias, back pain and myalgias.  Skin: Negative for rash.  Neurological: Negative for dizziness, light-headedness and headaches.    Allergies  Patient has no known allergies.  Home Medications   Prior to Admission medications   Medication Sig Start Date End Date Taking? Authorizing Provider  amoxicillin (AMOXIL) 875 MG tablet Take 1 tablet (875 mg total) by mouth 2 (two) times daily. 10/12/15   Lattie HawBeese, Stephen A, MD  amoxicillin-clavulanate (AUGMENTIN) 875-125 MG tablet Take 1 tablet by mouth 2 (two) times daily. One po bid x 7 days 11/26/15   Lurene ShadowPhelps, Merrillyn Ackerley O, PA-C  Ascorbic Acid (VITAMIN C) 1000 MG tablet Take 1,000 mg by mouth daily.    [provider]  atorvastatin (LIPITOR) 20 MG tablet Take 1 tablet by mouth  daily 08/23/15   Runell GessBerry, Jonathan J, MD  benazepril-hydrochlorthiazide (LOTENSIN HCT) 20-12.5 MG tablet Take 1 tablet by mouth  daily 08/23/15   Nanetta BattyBerry, Jonathan  J, MD  benzonatate (TESSALON) 100 MG capsule Take 1-2 capsules (100-200 mg total) by mouth every 8 (eight) hours. 11/26/15   Lurene Shadow, PA-C  cetirizine (ZYRTEC) 10 MG tablet Take 10 mg by mouth daily.    [provider]  chlorhexidine (PERIDEX) 0.12 % solution Use as directed 15 mLs in the mouth or throat 2 (two) times daily.  03/18/12   [provider]  ciprofloxacin (CIPRO) 500 MG tablet Take 1 tablet (500 mg total) by mouth 2 (two) times daily. One po bid x 5 days 06/23/16   Lurene Shadow, PA-C  diclofenac (VOLTAREN) 75 MG EC tablet Take 75 mg by mouth 2 (two) times daily.  04/07/13   [provider]  diclofenac sodium (VOLTAREN) 1 % GEL Apply 1 application topically 3 (three) times daily. Apply 1 to 2 gm each application 01/06/14   Lattie Haw, MD  dicyclomine (BENTYL) 20 MG tablet Take 1 tablet (20 mg total) by mouth 2 (two) times daily. 06/23/16   Lurene Shadow, PA-C  EPIPEN 2-PAK 0.3 MG/0.3ML SOAJ Inject 0.3 mg into the skin as needed. 05/13/12   [provider]  fluticasone (FLONASE) 50 MCG/ACT nasal spray Place 1 spray into both nostrils daily. 05/07/13   Gordy Savers, MD  Ginkgo Biloba 40 MG TABS Take 120 mg by mouth daily.    [provider]  Glucos-MSM-C-Mn-Ginger-Willow (GLUCOSAMINE MSM COMPLEX PO) Take 1,500 mg by mouth 2 (two) times daily.    [provider]  metroNIDAZOLE (FLAGYL) 500 MG tablet Take 1 tablet (500 mg total) by mouth 2 (two) times daily. One po bid x 7 days 06/23/16   Lurene Shadow, PA-C  ondansetron [facility] ODT) 4 MG disintegrating tablet 4mg  ODT q4 hours prn nausea/vomit 06/23/16   Lurene Shadow, PA-C  pantoprazole (PROTONIX) 40 MG tablet Take 1 tablet by mouth  daily 08/23/15   Runell Gess, MD  predniSONE (DELTASONE) 20 MG tablet Take one tab by mouth twice daily for 5 days, then one daily. Take with food. 10/12/15   Lattie Haw, MD  predniSONE (DELTASONE) 20 MG tablet 3 tabs po day one, then 2 po daily x 4 days 11/26/15   Lurene Shadow, PA-C  sodium chloride (OCEAN) 0.65 % SOLN nasal spray Place 1 spray into both nostrils as needed. 09/22/14   Lurene Shadow, PA-C   Meds Ordered and Administered this Visit   Medications  ondansetron (ZOFRAN-ODT) disintegrating tablet 4 mg (4 mg Oral Given 06/23/16 1647)    BP 110/76 (BP Location: Right Arm)   Pulse 100   Temp 97.9 F (36.6 C) (Oral)   Wt 198 lb (89.8 kg)   SpO2 98%   BMI 32.95 kg/m  No data found.   Physical Exam  Constitutional: She is oriented to person, place, and time. She appears well-developed and well-nourished.  No distress.  HENT:  Head: Normocephalic and atraumatic.  Right Ear: Tympanic membrane normal.  Left Ear: Tympanic membrane normal.  Nose: Nose normal.  Mouth/Throat: Uvula is midline, oropharynx is clear and moist and mucous membranes are normal.  Eyes: EOM are normal.  Neck: Normal range of motion. Neck supple.  Cardiovascular: Normal rate and regular rhythm.   Pulmonary/Chest: Effort normal and breath sounds normal. No respiratory distress. She has no wheezes. She has no rales.  Abdominal: Soft. Bowel sounds are normal. She exhibits no distension and no mass. There is no tenderness. There is no rebound and no guarding.  Musculoskeletal: Normal range of motion.  Neurological: She is alert and oriented to person, place, and time.  Skin: Skin is warm and dry. She is not diaphoretic.  Psychiatric: She has a normal mood and affect. Her behavior is normal.  Nursing note and vitals reviewed.   Urgent Care Course     Procedures (including critical care time)  Labs Review Labs Reviewed  GASTROINTESTINAL PATHOGEN PANEL PCR    Imaging Review No results found.   MDM   1. Diarrhea of presumed infectious origin   2. Nausea     Pt has used bathroom 3-4 times since being in UC. Able to provide stool sample.  Concern for bacterial cause of diarrhea given reported severity.  Zofran given in UC. No vomiting Will start pt on prophylactic  Cipro and Flagyl while stool sample pending in lab.  Rx: bentyl (only use for severe diarrhea, going every 30 minutes or more), zofran, cipro and flagyl  Encouraged to stay well hydrated Discussed symptoms that warrant emergent care in the ED.    Lurene Shadow, [place] 06/24/16 1121

## 2016-06-24 ENCOUNTER — Telehealth: Payer: Self-pay | Admitting: Emergency Medicine

## 2016-06-24 NOTE — Telephone Encounter (Signed)
Pt requesting an out of work note for Monday and Tuesday.  Pt was seen on Saturday and per Denny Peonrin, an out of work note given to pt.  Pt informed and will come by to pick up.  TMartin,CMA

## 2016-06-25 ENCOUNTER — Telehealth: Payer: Self-pay | Admitting: *Deleted

## 2016-06-25 LAB — GASTROINTESTINAL PATHOGEN PANEL PCR
C. difficile Tox A/B, PCR: NOT DETECTED
Campylobacter, PCR: NOT DETECTED
Cryptosporidium, PCR: NOT DETECTED
E coli (ETEC) LT/ST PCR: NOT DETECTED
E coli (STEC) stx1/stx2, PCR: NOT DETECTED
E coli 0157, PCR: NOT DETECTED
Giardia lamblia, PCR: DETECTED — CR
Norovirus, PCR: NOT DETECTED
Rotavirus A, PCR: NOT DETECTED
Salmonella, PCR: NOT DETECTED
Shigella, PCR: NOT DETECTED

## 2016-06-25 NOTE — Telephone Encounter (Signed)
Callback: Patient informed of stool panel results. Per Dr. Cathren HarshBeese may stop Cipro but continue Flagyl. Patient verbalized understanding. She feels she is improving some but diarrhea is still present.

## 2016-07-24 ENCOUNTER — Other Ambulatory Visit: Payer: Self-pay | Admitting: Gastroenterology

## 2016-07-24 DIAGNOSIS — R109 Unspecified abdominal pain: Secondary | ICD-10-CM

## 2016-07-25 ENCOUNTER — Other Ambulatory Visit: Payer: Self-pay | Admitting: Cardiovascular Disease

## 2016-07-27 ENCOUNTER — Other Ambulatory Visit: Payer: Self-pay | Admitting: Obstetrics and Gynecology

## 2016-07-27 DIAGNOSIS — Z1239 Encounter for other screening for malignant neoplasm of breast: Secondary | ICD-10-CM

## 2016-07-31 ENCOUNTER — Ambulatory Visit
Admission: RE | Admit: 2016-07-31 | Discharge: 2016-07-31 | Disposition: A | Discharge status: Home / Self Care | Payer: BLUE CROSS/BLUE SHIELD | Source: Ambulatory Visit | Attending: Gastroenterology | Admitting: Gastroenterology

## 2016-07-31 DIAGNOSIS — R109 Unspecified abdominal pain: Secondary | ICD-10-CM

## 2016-07-31 MED ORDER — IOPAMIDOL (ISOVUE-300) INJECTION 61%
100.0000 mL | Freq: Once | INTRAVENOUS | Status: AC | PRN
  Administered 2016-07-31: 100 mL via INTRAVENOUS

## 2016-08-03 ENCOUNTER — Ambulatory Visit (INDEPENDENT_AMBULATORY_CARE_PROVIDER_SITE_OTHER): Payer: BLUE CROSS/BLUE SHIELD

## 2016-08-03 DIAGNOSIS — Z1239 Encounter for other screening for malignant neoplasm of breast: Secondary | ICD-10-CM

## 2016-08-03 DIAGNOSIS — Z1231 Encounter for screening mammogram for malignant neoplasm of breast: Secondary | ICD-10-CM | POA: Diagnosis not present

## 2016-11-11 ENCOUNTER — Emergency Department (INDEPENDENT_AMBULATORY_CARE_PROVIDER_SITE_OTHER)
Admission: EM | Admit: 2016-11-11 | Discharge: 2016-11-11 | Disposition: A | Discharge status: Home / Self Care | Payer: BLUE CROSS/BLUE SHIELD | Source: Home / Self Care | Attending: Family Medicine | Admitting: Family Medicine

## 2016-11-11 ENCOUNTER — Encounter: Payer: Self-pay | Admitting: Emergency Medicine

## 2016-11-11 DIAGNOSIS — Z23 Encounter for immunization: Secondary | ICD-10-CM

## 2016-11-11 DIAGNOSIS — R21 Rash and other nonspecific skin eruption: Secondary | ICD-10-CM

## 2016-11-11 MED ORDER — INFLUENZA VAC SPLIT QUAD 0.5 ML IM SUSY
0.5000 mL | PREFILLED_SYRINGE | INTRAMUSCULAR | Status: AC
  Administered 2016-11-11: 0.5 mL via INTRAMUSCULAR

## 2016-11-11 MED ORDER — INFLUENZA VAC SPLIT QUAD 0.5 ML IM SUSY: 0.5000 mL | PREFILLED_SYRINGE | INTRAMUSCULAR | Status: DC

## 2016-11-11 MED ORDER — ERYTHROMYCIN 2 % EX OINT: TOPICAL_OINTMENT | CUTANEOUS | 0 refills | Status: DC

## 2016-11-11 NOTE — ED Provider Notes (Signed)
[facility]    CSN: [id number] Arrival date & time: 11/11/16  1320     History   Chief Complaint Chief Complaint  Patient presents with  . Rash    HPI Jessica Glass is a 51 y.o. female.   HPI Jessica Glass is a 51 y.o. female presenting to UC with c/o erythematous dried rash on buttocks and bilateral upper thighs for a few weeks.  She initially though it was heat rash but states it has persisted. She has not tried anything for her symptoms as it does not itch or hurt but she can feel the rash and her partner has advised her the rash has been present for several weeks. No new soaps, lotions or medications. Denies fever or chills.  Pt also requesting a flu vaccine as she typically gets on every year.    Past Medical History:  Diagnosis Date  . Arrhythmia 05/26/09   ECHO ALL CHAMBERS  ARE NORMAL IN SIZE AND FUNCTION EF70%  . GERD (gastroesophageal reflux disease)   . Hyperlipidemia   . Hypertension   . Obstructive sleep apnea    currently wearing CPAP  . Seasonal allergies     Patient Active Problem List   Diagnosis Date Noted  . Obstructive sleep apnea 04/10/2013  . Hypertension   . Hyperlipidemia   . BACK PAIN 02/28/2007  . GERD 08/21/2006  . Essential hypertension 08/15/2006  . ALLERGIC RHINITIS 08/15/2006    Past Surgical History:  Procedure Laterality Date  . ADENOIDECTOMY    . BLADDER SUSPENSION    . DEBRIDEMENT TENNIS ELBOW    . PATELLA RECONSTRUCTION Left   . TONSILLECTOMY      OB History    No data available       Home Medications    Prior to Admission medications   Medication Sig Start Date End Date Taking? Authorizing Provider  Cholecalciferol (VITAMIN D PO) Take by mouth.   Yes [provider]  GABAPENTIN PO Take by mouth.   Yes [provider]  diclofenac (VOLTAREN) 75 MG EC tablet Take 75 mg by mouth 2 (two) times daily. 04/07/13   [provider]  Erythromycin 2 % ointment Apply thin layer to rash  2-3 times daily for 7 days 11/11/16   Lurene ShadowPhelps, Handy Mcloud O, PA-C    Family History Family History  Problem Relation Age of Onset  . Cancer Mother        ovarian  . Hypertension Mother   . Diabetes Mother   . Heart disease Mother   . Hyperlipidemia Father   . Hypertension Father   . Cancer Maternal Aunt        breast  . Breast cancer Paternal Aunt [id number]    Social History Social History   Tobacco Use  . Smoking status: Former Smoker    Packs/day: 0.25    Types: Cigarettes    Last attempt to quit: 06/06/2007    Years since quitting: 9.4  . Smokeless tobacco: Never Used  Substance Use Topics  . Alcohol use: No    Alcohol/week: 0.0 oz  . Drug use: No     Allergies   Patient has no known allergies.   Review of Systems Review of Systems  Constitutional: Negative for chills and fever.  Musculoskeletal: Negative for arthralgias, joint swelling and myalgias.  Skin: Positive for rash. Negative for wound.     Physical Exam Triage Vital Signs ED Triage Vitals  Enc Vitals Group     BP  11/11/16 1356 138/85     Pulse Rate 11/11/16 1356 83     Resp --      Temp 11/11/16 1356 (!) 97.5 F (36.4 C)     Temp Source 11/11/16 1356 Oral     SpO2 11/11/16 1356 98 %     Weight 11/11/16 1357 206 lb 12 oz (93.8 kg)     Height 11/11/16 1357 5\' 5"  (1.651 m)     Head Circumference --      Peak Flow --      Pain Score 11/11/16 1357 0     Pain Loc --      Pain Edu? --      Excl. in GC? --    No data found.  Updated Vital Signs BP 138/85 (BP Location: Left Arm)   Pulse 83   Temp (!) 97.5 F (36.4 C) (Oral)   Ht 5\' 5"  (1.651 m)   Wt 206 lb 12 oz (93.8 kg)   SpO2 98%   BMI 34.41 kg/m   Visual Acuity Right Eye Distance:   Left Eye Distance:   Bilateral Distance:    Right Eye Near:   Left Eye Near:    Bilateral Near:     Physical Exam  Constitutional: She is oriented to person, place, and time. She appears well-developed and well-nourished. No distress.  HENT:  Head:  Normocephalic and atraumatic.  Eyes: EOM are normal.  Neck: Normal range of motion.  Cardiovascular: Normal rate.  Pulmonary/Chest: Effort normal.  Musculoskeletal: Normal range of motion.  Neurological: She is alert and oriented to person, place, and time.  Skin: Skin is warm and dry. Rash noted. She is not diaphoretic. There is erythema.  Bilateral upper thighs: faint erythematous dried papular rash. Non-tender. No discharge.  Psychiatric: She has a normal mood and affect. Her behavior is normal.  Nursing note and vitals reviewed.    UC Treatments / Results  Labs (all labs ordered are listed, but only abnormal results are displayed) Labs Reviewed - No data to display  EKG  EKG Interpretation None       Radiology No results found.  Procedures Procedures (including critical care time)  Medications Ordered in UC Medications  Influenza vac split quadrivalent PF (FLUARIX) injection 0.5 mL (0.5 mLs Intramuscular Given 11/11/16 1414)     Initial Impression / Assessment and Plan / UC Course  I have reviewed the triage vital signs and the nursing notes.  Pertinent labs & imaging results that were available during my care of the patient were reviewed by me and considered in my medical decision making (see chart for details).      Rash c/w heat rash vs folliculitis Given duration of rash, will treat with topical erythromycin ointment for 1 week Home care instructions provided F/u with PCP as needed.   Final Clinical Impressions(s) / UC Diagnoses   Final diagnoses:  Rash  Flu vaccine need    New Prescriptions This SmartLink is deprecated. Use AVSMEDLIST instead to display the medication list for a patient.    Controlled Substance Prescriptions  Controlled Substance Registry consulted? Not Applicable   Rolla Plate 11/11/16 1426

## 2016-11-11 NOTE — ED Triage Notes (Signed)
Patient complaining of a rash on the back of the leg and buttocks for several weeks, no itching just irritated and burning.

## 2017-06-26 ENCOUNTER — Other Ambulatory Visit: Payer: Self-pay | Admitting: Obstetrics and Gynecology

## 2017-06-26 DIAGNOSIS — Z1231 Encounter for screening mammogram for malignant neoplasm of breast: Secondary | ICD-10-CM

## 2017-08-09 ENCOUNTER — Ambulatory Visit: Payer: BLUE CROSS/BLUE SHIELD

## 2017-08-16 ENCOUNTER — Ambulatory Visit (INDEPENDENT_AMBULATORY_CARE_PROVIDER_SITE_OTHER): Payer: Managed Care, Other (non HMO)

## 2017-08-16 DIAGNOSIS — Z1231 Encounter for screening mammogram for malignant neoplasm of breast: Secondary | ICD-10-CM | POA: Diagnosis not present

## 2017-12-20 DIAGNOSIS — M47812 Spondylosis without myelopathy or radiculopathy, cervical region: Secondary | ICD-10-CM

## 2017-12-20 HISTORY — DX: Spondylosis without myelopathy or radiculopathy, cervical region: M47.812

## 2018-01-02 ENCOUNTER — Ambulatory Visit (INDEPENDENT_AMBULATORY_CARE_PROVIDER_SITE_OTHER): Payer: Managed Care, Other (non HMO) | Admitting: Cardiovascular Disease

## 2018-01-02 ENCOUNTER — Encounter: Payer: Self-pay | Admitting: Cardiovascular Disease

## 2018-01-02 DIAGNOSIS — R0789 Other chest pain: Secondary | ICD-10-CM

## 2018-01-02 DIAGNOSIS — E782 Mixed hyperlipidemia: Secondary | ICD-10-CM

## 2018-01-02 DIAGNOSIS — I10 Essential (primary) hypertension: Secondary | ICD-10-CM

## 2018-01-02 DIAGNOSIS — R079 Chest pain, unspecified: Secondary | ICD-10-CM

## 2018-01-02 NOTE — Assessment & Plan Note (Signed)
History of essential hypertension blood pressure measured today 134/92.  She is on benazepril and hydrochlorothiazide.

## 2018-01-02 NOTE — Patient Instructions (Signed)
Medication Instructions:  Your physician recommends that you continue on your current medications as directed. Please refer to the Current Medication list given to you today.  If you need a refill on your cardiac medications before your next appointment, please call your pharmacy.   Lab work: Your physician recommends that you return for lab work: LIPID/LIVER  If you have labs (blood work) drawn today and your tests are completely normal, you will receive your results only by: Marland Kitchen. MyChart Message (if you have MyChart) OR . A paper copy in the mail If you have any lab test that is abnormal or we need to change your treatment, we will call you to review the results.  Testing/Procedures: Your physician has requested that you have an exercise stress myoview. For further information please visit [email or web address]. Please follow instruction sheet, as given.  SCHEDULE FOR NEXT WEEK  Follow-Up: At [facility], you and your health needs are our priority.  As part of our continuing mission to provide you with exceptional heart care, we have created designated Provider Care Teams.  These Care Teams include your primary Cardiologist (physician) and Advanced Practice Providers (APPs -  Physician Assistants and Nurse Practitioners) who all work together to provide you with the care you need, when you need it. You will need a follow up appointment in 1 MONTH unless your results are abnormal. You may see DR. BERRY or one of the following Advanced Practice Providers on your designated Care Team:   Corine ShelterLuke Kilroy, PA-C [place]rista Kroeger, [place]PA-C . Marjie Skiffallie Goodrich, PA-C

## 2018-01-02 NOTE — Progress Notes (Signed)
01/02/2018 Waneda Wilburn MylarM Haider   Jul 05, 1965  [id number]  Primary Physician Phoebe SharpsKiker, Glenetta Hewameron S, NP-C Primary Cardiologist: Runell GessJonathan J Ayaan Shutes MD FACP, [place]ACC, [place]HA, [place]  HPI:  Dyane Dustmanammy M Dasher is a 52 y.o.  moderately overweight single Caucasian female who I last saw in the office 08/03/2015.  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 Her factors include remote tobacco abuse, hyperlipidemia and hypertension. She denies chest pain or shortness of breath. She did see me for tachypalpitations. A 2-D echo was normal as were thyroid function tests. Her symptoms are somewhat improved over last several years. She also complains of symptoms compatible type of sleep apnea. As a result of lab work which showed elevated cholesterol with an LDL of 149 I began her on atorvastatin. We will recheck a lipid and liver profile. She did have a sleep study which was abnormal which led to a prescription for CPAP which he wears and benefits from.  Since I saw her 2 years ago she is done well until recently when she is noticed new onset chest pain which began approximately a month ago.  She is had approximately half a dozen episodes lasting for minutes at a time.  She is on Protonix for GERD however she thinks the symptoms are different.  Her father had bypass surgery last year at the age of 880.  Current Meds  Medication Sig  . atorvastatin (LIPITOR) 20 MG tablet Take 20 mg by mouth daily.  . benazepril-hydrochlorthiazide (LOTENSIN HCT) 20-12.5 MG tablet Take 1 tablet by mouth daily.  . cetirizine (ZYRTEC) 10 MG tablet Take 10 mg by mouth daily.  . Cholecalciferol (VITAMIN D PO) Take by mouth.  . diclofenac (VOLTAREN) 75 MG EC tablet Take 75 mg by mouth as needed.   Marland Kitchen. GABAPENTIN PO Take by mouth as needed.   . pantoprazole (PROTONIX) 40 MG tablet Take 40 mg by mouth daily.  . valACYclovir HCl (VALTREX PO) Take 1 tablet by mouth daily.  . [DISCONTINUED] Erythromycin 2 % ointment Apply thin layer to rash 2-3 times daily for 7 days     No Known Allergies  Social History   Socioeconomic History  . Marital status: Single    Spouse name: Not on file  . Number of children: Not on file  . Years of education: Not on file  . Highest education level: Not on file  Occupational History  . Not on file  Social Needs  . Financial resource strain: Not on file  . Food insecurity:    Worry: Not on file    Inability: Not on file  . Transportation needs:    Medical: Not on file    Non-medical: Not on file  Tobacco Use  . Smoking status: Former Smoker    Packs/day: 0.25    Types: Cigarettes    Last attempt to quit: 06/06/2007    Years since quitting: 10.5  . Smokeless tobacco: Never Used  Substance and Sexual Activity  . Alcohol use: No  . Drug use: No  . Sexual activity: Not on file  Lifestyle  . Physical activity:    Days per week: Not on file    Minutes per session: Not on file  . Stress: Not on file  Relationships  . Social connections:    Talks on phone: Not on file    Gets together: Not on file    Attends religious service: Not on file    Active member of club or organization: Not on file  Attends meetings of clubs or organizations: Not on file    Relationship status: Not on file  . Intimate partner violence:    Fear of current or ex partner: Not on file    Emotionally abused: Not on file    Physically abused: Not on file    Forced sexual activity: Not on file  Other Topics Concern  . Not on file  Social History Narrative  . Not on file     Review of Systems: General: negative for chills, fever, night sweats or weight changes.  Cardiovascular: negative for chest pain, dyspnea on exertion, edema, orthopnea, palpitations, paroxysmal nocturnal dyspnea or shortness of breath Dermatological: negative for rash Respiratory: negative for cough or wheezing Urologic: negative for hematuria Abdominal: negative for nausea, vomiting, diarrhea, bright red blood per rectum, melena, or hematemesis Neurologic:  negative for visual changes, syncope, or dizziness All other systems reviewed and are otherwise negative except as noted above.    Blood pressure (!) 134/92, pulse 84, height 5\' 5"  (1.651 m), weight 214 lb (97.1 kg).  General appearance: alert and no distress Neck: no adenopathy, no carotid bruit, no JVD, supple, symmetrical, trachea midline and thyroid not enlarged, symmetric, no tenderness/mass/nodules Lungs: clear to auscultation bilaterally Heart: regular rate and rhythm, S1, S2 normal, no murmur, click, rub or gallop Extremities: extremities normal, atraumatic, no cyanosis or edema Pulses: 2+ and symmetric Skin: Skin color, texture, turgor normal. No rashes or lesions Neurologic: Alert and oriented X 3, normal strength and tone. Normal symmetric reflexes. Normal coordination and gait  EKG sinus rhythm 84 without ST or T wave changes.  I personally reviewed this EKG.  ASSESSMENT AND PLAN:   Essential hypertension History of essential hypertension blood pressure measured today 134/92.  She is on benazepril and hydrochlorothiazide.  Hyperlipidemia History of hyperlipidemia on statin therapy.  We will recheck a lipid liver profile.  Chest pain Ms. Anthonette LegatoHarden returns today for complaints of atypical chest pain.  She has had approximately half a dozen episodes lasting for several minutes at a time are somewhat random.  She does have positive risk factors.  I am going to get an exercise Myoview stress test to further evaluate.      Runell GessJonathan J. Dreyah Montrose MD FACP,FACC,FAHA, [facility] Hospital[facility]SCAI 01/02/2018 10:42 AM

## 2018-01-02 NOTE — Assessment & Plan Note (Signed)
History of hyperlipidemia on statin therapy.  We will recheck a lipid liver profile 

## 2018-01-02 NOTE — Assessment & Plan Note (Signed)
[name] returns today for complaints of atypical chest pain.  She has had approximately half a dozen episodes lasting for several minutes at a time are somewhat random.  She does have positive risk factors.  I am going to get an exercise Myoview stress test to further evaluate.

## 2018-01-03 ENCOUNTER — Telehealth (HOSPITAL_COMMUNITY): Payer: Self-pay

## 2018-01-03 LAB — LIPID PANEL
Chol/HDL Ratio: 3.2 ratio (ref 0.0–4.4)
Cholesterol, Total: 139 mg/dL (ref 100–199)
HDL: 43 mg/dL (ref >39)
LDL Calculated: 81 mg/dL (ref 0–99)
Triglycerides: 74 mg/dL (ref 0–149)
VLDL Cholesterol Cal: 15 mg/dL (ref 5–40)

## 2018-01-03 LAB — HEPATIC FUNCTION PANEL
ALK PHOS: 78 IU/L (ref 39–117)
ALT: 24 IU/L (ref 0–32)
AST: 15 IU/L (ref 0–40)
Albumin: 4.4 g/dL (ref 3.5–5.5)
BILIRUBIN, DIRECT: 0.11 mg/dL (ref 0.00–0.40)
Bilirubin Total: 0.3 mg/dL (ref 0.0–1.2)
Total Protein: 6.5 g/dL (ref 6.0–8.5)

## 2018-01-03 NOTE — Telephone Encounter (Signed)
Encounter complete. 

## 2018-01-06 ENCOUNTER — Encounter: Payer: Self-pay | Admitting: *Deleted

## 2018-01-06 ENCOUNTER — Other Ambulatory Visit: Payer: Self-pay | Admitting: *Deleted

## 2018-01-06 DIAGNOSIS — R079 Chest pain, unspecified: Secondary | ICD-10-CM

## 2018-01-06 DIAGNOSIS — R0789 Other chest pain: Secondary | ICD-10-CM

## 2018-01-07 ENCOUNTER — Ambulatory Visit (HOSPITAL_COMMUNITY)
Admission: RE | Admit: 2018-01-07 | Payer: Managed Care, Other (non HMO) | Source: Ambulatory Visit | Attending: Cardiovascular Disease | Admitting: Cardiovascular Disease

## 2018-01-07 ENCOUNTER — Ambulatory Visit (HOSPITAL_COMMUNITY)
Admission: RE | Admit: 2018-01-07 | Discharge: 2018-01-07 | Disposition: A | Discharge status: Home / Self Care | Payer: Managed Care, Other (non HMO) | Source: Ambulatory Visit | Attending: Cardiology | Admitting: Cardiology

## 2018-01-07 DIAGNOSIS — R079 Chest pain, unspecified: Secondary | ICD-10-CM | POA: Diagnosis not present

## 2018-01-09 ENCOUNTER — Telehealth: Payer: Self-pay | Admitting: Cardiovascular Disease

## 2018-01-09 LAB — EXERCISE TOLERANCE TEST
CHL CUP MPHR: 167 {beats}/min
CHL RATE OF PERCEIVED EXERTION: 19
CSEPEDS: 15 s
CSEPHR: 101 %
CSEPPHR: 169 {beats}/min
Estimated workload: 10.5 METS
Exercise duration (min): 9 min
Rest HR: 89 {beats}/min

## 2018-01-09 NOTE — Telephone Encounter (Signed)
New Message    Patient calling about a back to work note stating she can't be out until next appt scheduled 02/05/18.  Please give her a call about issue of getting a return to work note.

## 2018-01-09 NOTE — Telephone Encounter (Signed)
Normal GXT on 01/07/2018.  Okay to return to work.

## 2018-01-09 NOTE — Telephone Encounter (Addendum)
Spoke with pt. Pt sts that on 01/02/18 she was written out of work by [employer] until testing was complete. Pt sts that she had her stress test on 01/07/18, her f/u appt is not until 02/05/18. Pt sts that she cannot remain out of work that long. Her employer will not allow her to return without written documentation. Pt sts that she needs a return to work letter. Adv pt that I will fwd the message to [facility] and his nurse and we will call back with his response. Pt verbalized understanding.

## 2018-01-10 ENCOUNTER — Encounter: Payer: Self-pay | Admitting: *Deleted

## 2018-01-10 NOTE — Telephone Encounter (Signed)
Spoke with pt, aware letter for work and results of GXT placed at the front desk for pick up.

## 2018-02-05 ENCOUNTER — Encounter: Payer: Self-pay | Admitting: Cardiovascular Disease

## 2018-02-05 ENCOUNTER — Ambulatory Visit (INDEPENDENT_AMBULATORY_CARE_PROVIDER_SITE_OTHER): Payer: Managed Care, Other (non HMO) | Admitting: Cardiovascular Disease

## 2018-02-05 DIAGNOSIS — R0789 Other chest pain: Secondary | ICD-10-CM | POA: Diagnosis not present

## 2018-02-05 NOTE — Patient Instructions (Signed)
Medication Instructions:  NONE If you need a refill on your cardiac medications before your next appointment, please call your pharmacy.   Lab work: NONE If you have labs (blood work) drawn today and your tests are completely normal, you will receive your results only by: . MyChart Message (if you have MyChart) OR . A paper copy in the mail If you have any lab test that is abnormal or we need to change your treatment, we will call you to review the results.  Testing/Procedures: NONE  Follow-Up: At [facility], you and your health needs are our priority.  As part of our continuing mission to provide you with exceptional heart care, we have created designated Provider Care Teams.  These Care Teams include your primary Cardiologist (physician) and Advanced Practice Providers (APPs -  Physician Assistants and Nurse Practitioners) who all work together to provide you with the care you need, when you need it. . You will need a follow up appointment in 12 months.  Please call our office 2 months in advance to schedule this appointment.  You may see Dr. Berry or one of the following Advanced Practice Providers on your designated Care Team:   . Luke Kilroy, PA-C . Hao Meng, PA-C . Angela Duke, PA-C . Kathryn Lawrence, DNP . Rhonda Barrett, PA-C . Krista Kroeger, PA-C . Callie Goodrich, PA-C   

## 2018-02-05 NOTE — Assessment & Plan Note (Signed)
[name] returns today for follow-up of her GXT which was performed in the evaluation of chest pain on 01/07/2018.  This was entirely normal.  Since that time she is had EGD which is demonstrated gastric ulcers.  Her PPI was dose was doubled and she is feeling somewhat improved.  I will see her back in 1 year for follow-up.

## 2018-02-05 NOTE — Progress Notes (Signed)
[name] returns today for follow-up of her GXT which was performed in the evaluation of chest pain on 01/07/2018.  This was entirely normal.  Since that time she is had EGD which is demonstrated gastric ulcers.  Her PPI was dose was doubled and she is feeling somewhat improved.  I will see her back in 1 year for follow-up.  Runell Gess, M.D., FACP, [facility], [facility] [address] [place], [place]  [address]  [phone] 02/05/2018 9:17 AM

## 2018-09-29 ENCOUNTER — Other Ambulatory Visit: Payer: Self-pay | Admitting: Obstetrics and Gynecology

## 2018-09-29 DIAGNOSIS — Z1231 Encounter for screening mammogram for malignant neoplasm of breast: Secondary | ICD-10-CM

## 2018-10-02 ENCOUNTER — Other Ambulatory Visit: Payer: Self-pay

## 2018-10-02 ENCOUNTER — Ambulatory Visit (INDEPENDENT_AMBULATORY_CARE_PROVIDER_SITE_OTHER): Payer: Managed Care, Other (non HMO)

## 2018-10-02 DIAGNOSIS — Z1231 Encounter for screening mammogram for malignant neoplasm of breast: Secondary | ICD-10-CM

## 2018-12-10 ENCOUNTER — Encounter: Payer: Self-pay | Admitting: *Deleted

## 2018-12-11 ENCOUNTER — Telehealth: Payer: Self-pay | Admitting: Nurse Practitioner

## 2018-12-11 ENCOUNTER — Other Ambulatory Visit: Payer: Self-pay | Admitting: Specialist

## 2018-12-11 DIAGNOSIS — M545 Low back pain, unspecified: Secondary | ICD-10-CM

## 2018-12-11 DIAGNOSIS — G8929 Other chronic pain: Secondary | ICD-10-CM

## 2018-12-11 NOTE — Telephone Encounter (Signed)
Phone call to patient to verify medication list and allergies for myelogram procedure. Pt aware she will not need to hold any medications for this procedure. Pre and post procedure instructions reviewed with pt. Pt verbalized understanding. 

## 2018-12-15 ENCOUNTER — Telehealth: Payer: Self-pay

## 2018-12-15 ENCOUNTER — Other Ambulatory Visit: Payer: Self-pay

## 2018-12-15 ENCOUNTER — Encounter: Payer: Self-pay | Admitting: Neurology

## 2018-12-15 ENCOUNTER — Ambulatory Visit (INDEPENDENT_AMBULATORY_CARE_PROVIDER_SITE_OTHER): Payer: Managed Care, Other (non HMO) | Admitting: Neurology

## 2018-12-15 VITALS — BP 154/95 | HR 87 | Temp 97.6°F | Ht 64.5 in | Wt 216.0 lb

## 2018-12-15 DIAGNOSIS — M79671 Pain in right foot: Secondary | ICD-10-CM

## 2018-12-15 DIAGNOSIS — M79672 Pain in left foot: Secondary | ICD-10-CM | POA: Diagnosis not present

## 2018-12-15 DIAGNOSIS — R200 Anesthesia of skin: Secondary | ICD-10-CM | POA: Diagnosis not present

## 2018-12-15 DIAGNOSIS — R202 Paresthesia of skin: Secondary | ICD-10-CM | POA: Diagnosis not present

## 2018-12-15 NOTE — Telephone Encounter (Signed)
Pt filled out and signed MR release form for [facility]. Given to MR for processing.

## 2018-12-15 NOTE — Patient Instructions (Signed)
Your neurological exam is quite good, no obvious abnormality noted.  Nevertheless, we will do work-up for possible neuropathy, i.e. nerve damage.  We will do blood work today and call you with results and also schedule you for your nerve conduction velocity test, you have had this before at Dr. Desiree Hane office, we will try to get his records as well. We will call you with your EMG and nerve conduction test result and follow-up if needed.
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
## 2018-12-16 NOTE — Telephone Encounter (Signed)
FYI:  I was able to review neurological records from 08/23/2016 when patient saw Dr. Boyd Kerbs at [facility] neurology call center.  She reported a 1 month history of constant sharp pain in both feet and legs.  She had associated numbness and tingling, radiating up to bilateral ankles.  She had tried nonsteroidal anti-inflammatory medication, she had failed physical therapy and was ordered a TENS unit.  Lumbar epidural steroid injection was considered.I reviewed visit note from 11/08/2016, patient was on gabapentin 300 mg strength, reportedly her epidural steroid injection was beneficial and she had a repeat injection, there was report of restless leg symptoms at night for which gabapentin was helpful.She was noted to have reduced grip strength secondary to carpal tunnel syndrome.  A Inversion table was recommended.  For carpal tunnel syndrome, wrist splinting was recommended and for restless leg syndrome, she was advised to continue with gabapentin.  She had an EMG and nerve conduction velocity test on 08/23/2016 and I reviewed the results: Impression: This is an abnormal study.  There is electrophysiologic evidence of #1 multilevel lumbosacral radiculopathy.  Acute on chronic.  #2. Multilevel cervical radiculopathy.  #3. Moderate bilateral median mononeuropathy at the wrist, consistent with carpal tunnel syndrome.

## 2018-12-16 NOTE — Telephone Encounter (Signed)
Received records from [facility]. Given to Dr. Rexene Alberts for review.

## 2018-12-16 NOTE — Telephone Encounter (Signed)
Noted  

## 2018-12-19 ENCOUNTER — Ambulatory Visit
Admission: RE | Admit: 2018-12-19 | Discharge: 2018-12-19 | Disposition: A | Discharge status: Home / Self Care | Payer: Managed Care, Other (non HMO) | Source: Ambulatory Visit | Attending: Specialist | Admitting: Specialist

## 2018-12-19 DIAGNOSIS — M545 Low back pain, unspecified: Secondary | ICD-10-CM

## 2018-12-19 DIAGNOSIS — G8929 Other chronic pain: Secondary | ICD-10-CM

## 2018-12-19 MED ORDER — IOPAMIDOL (ISOVUE-M 200) INJECTION 41%
18.0000 mL | Freq: Once | INTRAMUSCULAR | Status: AC
  Administered 2018-12-19: 18 mL via INTRATHECAL

## 2018-12-19 MED ORDER — DIAZEPAM 5 MG PO TABS
10.0000 mg | ORAL_TABLET | Freq: Once | ORAL | Status: AC
  Administered 2018-12-19: 10 mg via ORAL

## 2018-12-19 NOTE — Discharge Instructions (Signed)

## 2018-12-22 ENCOUNTER — Telehealth: Payer: Self-pay

## 2018-12-22 LAB — MULTIPLE MYELOMA PANEL, SERUM
Albumin SerPl Elph-Mcnc: 3.7 g/dL (ref 2.9–4.4)
Albumin/Glob SerPl: 1.2 (ref 0.7–1.7)
Alpha 1: 0.2 g/dL (ref 0.0–0.4)
Alpha2 Glob SerPl Elph-Mcnc: 0.9 g/dL (ref 0.4–1.0)
B-Globulin SerPl Elph-Mcnc: 1 g/dL (ref 0.7–1.3)
Gamma Glob SerPl Elph-Mcnc: 1.2 g/dL (ref 0.4–1.8)
Globulin, Total: 3.3 g/dL (ref 2.2–3.9)
IgA/Immunoglobulin A, Serum: 124 mg/dL (ref 87–352)
IgG (Immunoglobin G), Serum: 1022 mg/dL (ref 586–1602)
IgM (Immunoglobulin M), Srm: 171 mg/dL (ref 26–217)
Total Protein: 7 g/dL (ref 6.0–8.5)

## 2018-12-22 LAB — TSH: TSH: 3.08 u[IU]/mL (ref 0.450–4.500)

## 2018-12-22 LAB — RHEUMATOID FACTOR: Rheumatoid fact SerPl-aCnc: <10 IU/mL (ref 0.0–13.9)

## 2018-12-22 LAB — VITAMIN B6: Vitamin B6: 7 ug/L (ref 2.0–32.8)

## 2018-12-22 LAB — HEAVY METALS PROFILE II, BLOOD
Arsenic: 6 ug/L (ref 2–23)
Cadmium: <0.5 ug/L (ref 0.0–1.2)
Lead, Blood: <1 ug/dL (ref 0–4)
Mercury: <1 ug/L (ref 0.0–14.9)

## 2018-12-22 LAB — C-REACTIVE PROTEIN: CRP: 1 mg/L (ref 0–10)

## 2018-12-22 LAB — RPR: RPR Ser Ql: NONREACTIVE

## 2018-12-22 LAB — VITAMIN B1: Thiamine: 115.6 nmol/L (ref 66.5–200.0)

## 2018-12-22 LAB — HGB A1C W/O EAG: Hgb A1c MFr Bld: 5.8 % — ABNORMAL HIGH (ref 4.8–5.6)

## 2018-12-22 LAB — SEDIMENTATION RATE: Sed Rate: 4 mm/hr (ref 0–40)

## 2018-12-22 LAB — ANA W/REFLEX: Anti Nuclear Antibody (ANA): NEGATIVE

## 2018-12-22 NOTE — Telephone Encounter (Signed)
Pt returned my call. I advised her of the lab results and recommendations. Pt verbalized understanding of results. Pt had no questions at this time but was encouraged to call back if questions arise.

## 2018-12-22 NOTE — Telephone Encounter (Signed)
I called pt to discuss her lab results. No answer, left a message asking her to call me back. 

## 2018-12-22 NOTE — Telephone Encounter (Signed)
-----   Message from Star Age, MD sent at 12/22/2018 10:19 AM EST ----- Please advise patient that her labs were unremarkable with the exception of her diabetes marker called hemoglobin A1c in the prediabetes range.  Please advise patient to follow-up with her primary care nurse practitioner or physician to talk about prediabetes management, weight management is typically part of this, as well as dietary management.

## 2018-12-22 NOTE — Progress Notes (Signed)
Please advise patient that her labs were unremarkable with the exception of her diabetes marker called hemoglobin A1c in the prediabetes range.  Please advise patient to follow-up with her primary care nurse practitioner or physician to talk about prediabetes management, weight management is typically part of this, as well as dietary management.

## 2019-02-04 ENCOUNTER — Telehealth: Payer: Self-pay

## 2019-02-04 ENCOUNTER — Other Ambulatory Visit: Payer: Self-pay

## 2019-02-04 ENCOUNTER — Encounter (INDEPENDENT_AMBULATORY_CARE_PROVIDER_SITE_OTHER): Payer: Managed Care, Other (non HMO) | Admitting: Neurology

## 2019-02-04 ENCOUNTER — Ambulatory Visit (INDEPENDENT_AMBULATORY_CARE_PROVIDER_SITE_OTHER): Payer: Managed Care, Other (non HMO) | Admitting: Neurology

## 2019-02-04 DIAGNOSIS — R202 Paresthesia of skin: Secondary | ICD-10-CM | POA: Diagnosis not present

## 2019-02-04 DIAGNOSIS — M79672 Pain in left foot: Secondary | ICD-10-CM

## 2019-02-04 DIAGNOSIS — M79671 Pain in right foot: Secondary | ICD-10-CM

## 2019-02-04 DIAGNOSIS — R200 Anesthesia of skin: Secondary | ICD-10-CM

## 2019-02-04 DIAGNOSIS — Z0289 Encounter for other administrative examinations: Secondary | ICD-10-CM

## 2019-02-04 NOTE — Telephone Encounter (Signed)
Lvm asking for pt to call back so we could review EMG/NCS study result.

## 2019-02-04 NOTE — Telephone Encounter (Signed)
Pt returned call. Please call back when available. 

## 2019-02-04 NOTE — Telephone Encounter (Signed)
I returned the pt's call and advised of results. Pt verbalized understanding.  Pt was advised at this point we could see her back if symptoms worsen or fail to improve. I also encouraged she speak with her PCP about the normal emg/ncs study.  Pt was agreeable.

## 2019-02-04 NOTE — Telephone Encounter (Signed)
-----   Message from Huston Foley, MD sent at 02/04/2019  8:37 AM EST ----- Please call and advise the patient that the recent EMG and nerve conduction velocity test, which is the electrical nerve and muscle test we we performed, was reported as within normal limits. We checked for abnormal electrical discharges in the muscles or nerves and the report suggested normal findings. No evidence of pinched nerve type findings from the lower back. Labs were benign too, with the exception of pre-diabetes.  No further action is required on this test at this time. At this juncture, she can FU with her PCP.  Huston Foley, MD, PhD

## 2019-02-04 NOTE — Procedures (Signed)
Full Name: Jessica Glass Gender: Female MRN #: [id number] Date of Birth: 07/27/1965    Visit Date: 02/04/2019 07:24 Age: 54 Years Examining Physician: Marcial Pacas, MD  Referring Physician: Star Age, MD History: 54 year old female complains of few months history of intermittent bilateral foot shooting pain, sensitivity, she also complains of intermittent low back pain  Summary of the tests: Nerve conduction study: Bilateral sural, superficial peroneal sensory responses were normal.  Bilateral tibial, peroneal normal.  Electromyography: Selected needle examination of bilateral lower extremity muscles, and bilateral lumbosacral paraspinal muscles were normal.  Conclusion:  This is a normal study.  There is no electrodiagnostic evidence of large fiber peripheral neuropathy or bilateral lumbosacral radiculopathy.   ------------------------------- Marcial Pacas, M.D. PhD  [facility] [place], [place] [address] Tel: [phone] Fax: [phone]         [facility]    Nerve / Sites Muscle Latency Ref. Amplitude Ref. Rel Amp Segments Distance Velocity Ref. Area    ms ms mV mV %  cm m/s m/s mVms  L Peroneal - EDB     Ankle EDB 4.5 ?6.5 6.5 ?2.0 100 Ankle - EDB 9   20.3     Fib head EDB 10.8  5.8  89.4 Fib head - Ankle 28 45 ?44 19.1     Pop fossa EDB 13.1  5.4  93.8 Pop fossa - Fib head 10 44 ?44 18.1         Pop fossa - Ankle      R Peroneal - EDB     Ankle EDB 4.5 ?6.5 5.0 ?2.0 100 Ankle - EDB 9   17.2     Fib head EDB 10.4  4.5  90.2 Fib head - Ankle 28 48 ?44 17.0     Pop fossa EDB 12.4  4.1  92.6 Pop fossa - Fib head 10 48 ?44 15.1         Pop fossa - Ankle      L Tibial - AH     Ankle AH 5.3 ?5.8 6.4 ?4.0 100 Ankle - AH 9   15.8     Pop fossa AH 13.8  4.4  69.3 Pop fossa - Ankle 35 41 ?41 15.2  R Tibial - AH     Ankle AH 5.1 ?5.8 4.1 ?4.0 100 Ankle - AH 9   12.6     Pop fossa AH 12.8  2.2  54.1 Pop fossa - Ankle 35 46 ?41 13.6               SNC    Nerve / Sites Rec. Site Peak Lat Ref.  Amp Ref. Segments Distance    ms ms V V  cm  L Sural - Ankle (Calf)     Calf Ankle 3.8 ?4.4 8 ?6 Calf - Ankle 14  R Sural - Ankle (Calf)     Calf Ankle 3.6 ?4.4 12 ?6 Calf - Ankle 14  L Superficial peroneal - Ankle     Lat leg Ankle 3.9 ?4.4 8 ?6 Lat leg - Ankle 14  R Superficial peroneal - Ankle     Lat leg Ankle 3.9 ?4.4 7 ?6 Lat leg - Ankle 14             F  Wave    Nerve F Lat Ref.   ms ms  L Tibial - AH 53.8 ?56.0  R Tibial - AH 55.0 ?56.0  EMG Summary Table    Spontaneous MUAP Recruitment  Muscle IA Fib PSW Fasc Other Amp Dur. Poly Pattern  L. Tibialis anterior Normal None None None _______ Normal Normal Normal Normal  L. Tibialis posterior Normal None None None _______ Normal Normal Normal Normal  L. Peroneus longus Normal None None None _______ Normal Normal Normal Normal  L. Gastrocnemius (Medial head) Normal None None None _______ Normal Normal Normal Normal  L. Vastus lateralis Normal None None None _______ Normal Normal Normal Normal  R. Tibialis anterior Normal None None None _______ Normal Normal Normal Normal  R. Tibialis posterior Normal None None None _______ Normal Normal Normal Normal  R. Peroneus longus Normal None None None _______ Normal Normal Normal Normal  R. Gastrocnemius (Medial head) Normal None None None _______ Normal Normal Normal Normal  R.  Hallucis longus Normal None None None _______ Normal Normal Normal Normal  R. Vastus lateralis Normal None None None _______ Normal Normal Normal Normal  R. Lumbar paraspinals (mid) Normal None None None _______ Normal Normal Normal Normal  R. Lumbar paraspinals (low) Normal None None None _______ Normal Normal Normal Normal  L. Lumbar paraspinals (mid) Normal None None None _______ Normal Normal Normal Normal  L. Lumbar paraspinals (low) Normal None None None _______ Normal Normal Normal Normal

## 2019-02-04 NOTE — Progress Notes (Signed)
Please call and advise the patient that the recent EMG and nerve conduction velocity test, which is the electrical nerve and muscle test we we performed, was reported as within normal limits. We checked for abnormal electrical discharges in the muscles or nerves and the report suggested normal findings. No evidence of pinched nerve type findings from the lower back. Labs were benign too, with the exception of pre-diabetes.  No further action is required on this test at this time. At this juncture, she can FU with her PCP.  Huston Foley, MD, PhD

## 2019-11-06 ENCOUNTER — Other Ambulatory Visit: Payer: Self-pay | Admitting: Obstetrics and Gynecology

## 2019-11-06 DIAGNOSIS — Z1231 Encounter for screening mammogram for malignant neoplasm of breast: Secondary | ICD-10-CM

## 2019-12-24 ENCOUNTER — Ambulatory Visit: Payer: Managed Care, Other (non HMO)

## 2019-12-24 ENCOUNTER — Other Ambulatory Visit: Payer: Self-pay

## 2020-01-21 ENCOUNTER — Other Ambulatory Visit: Payer: Self-pay

## 2020-01-21 ENCOUNTER — Ambulatory Visit (INDEPENDENT_AMBULATORY_CARE_PROVIDER_SITE_OTHER): Payer: Managed Care, Other (non HMO)

## 2020-01-21 DIAGNOSIS — Z1231 Encounter for screening mammogram for malignant neoplasm of breast: Secondary | ICD-10-CM

## 2020-10-01 ENCOUNTER — Emergency Department (HOSPITAL_BASED_OUTPATIENT_CLINIC_OR_DEPARTMENT_OTHER): Payer: Managed Care, Other (non HMO)

## 2020-10-01 ENCOUNTER — Inpatient Hospital Stay (HOSPITAL_BASED_OUTPATIENT_CLINIC_OR_DEPARTMENT_OTHER)
Admission: EM | Admit: 2020-10-01 | Discharge: 2020-10-03 | DRG: 872 | Disposition: A | Discharge status: Home / Self Care | Payer: Managed Care, Other (non HMO) | Attending: Internal Medicine | Admitting: Internal Medicine

## 2020-10-01 ENCOUNTER — Other Ambulatory Visit: Payer: Self-pay

## 2020-10-01 ENCOUNTER — Encounter (HOSPITAL_BASED_OUTPATIENT_CLINIC_OR_DEPARTMENT_OTHER): Payer: Self-pay | Admitting: Emergency Medicine

## 2020-10-01 DIAGNOSIS — Z79899 Other long term (current) drug therapy: Secondary | ICD-10-CM | POA: Diagnosis not present

## 2020-10-01 DIAGNOSIS — N3000 Acute cystitis without hematuria: Secondary | ICD-10-CM | POA: Diagnosis present

## 2020-10-01 DIAGNOSIS — Z20822 Contact with and (suspected) exposure to covid-19: Secondary | ICD-10-CM | POA: Diagnosis present

## 2020-10-01 DIAGNOSIS — Z83438 Family history of other disorder of lipoprotein metabolism and other lipidemia: Secondary | ICD-10-CM | POA: Diagnosis not present

## 2020-10-01 DIAGNOSIS — K219 Gastro-esophageal reflux disease without esophagitis: Secondary | ICD-10-CM | POA: Diagnosis present

## 2020-10-01 DIAGNOSIS — M47812 Spondylosis without myelopathy or radiculopathy, cervical region: Secondary | ICD-10-CM | POA: Diagnosis present

## 2020-10-01 DIAGNOSIS — G4733 Obstructive sleep apnea (adult) (pediatric): Secondary | ICD-10-CM | POA: Diagnosis present

## 2020-10-01 DIAGNOSIS — N390 Urinary tract infection, site not specified: Secondary | ICD-10-CM

## 2020-10-01 DIAGNOSIS — J302 Other seasonal allergic rhinitis: Secondary | ICD-10-CM | POA: Diagnosis present

## 2020-10-01 DIAGNOSIS — E876 Hypokalemia: Secondary | ICD-10-CM | POA: Diagnosis present

## 2020-10-01 DIAGNOSIS — Z6837 Body mass index (BMI) 37.0-37.9, adult: Secondary | ICD-10-CM

## 2020-10-01 DIAGNOSIS — N10 Acute tubulo-interstitial nephritis: Secondary | ICD-10-CM | POA: Diagnosis present

## 2020-10-01 DIAGNOSIS — E785 Hyperlipidemia, unspecified: Secondary | ICD-10-CM | POA: Diagnosis present

## 2020-10-01 DIAGNOSIS — D649 Anemia, unspecified: Secondary | ICD-10-CM | POA: Diagnosis not present

## 2020-10-01 DIAGNOSIS — R6510 Systemic inflammatory response syndrome (SIRS) of non-infectious origin without acute organ dysfunction: Secondary | ICD-10-CM

## 2020-10-01 DIAGNOSIS — Z8249 Family history of ischemic heart disease and other diseases of the circulatory system: Secondary | ICD-10-CM

## 2020-10-01 DIAGNOSIS — E782 Mixed hyperlipidemia: Secondary | ICD-10-CM | POA: Diagnosis not present

## 2020-10-01 DIAGNOSIS — A419 Sepsis, unspecified organism: Secondary | ICD-10-CM

## 2020-10-01 DIAGNOSIS — A4151 Sepsis due to Escherichia coli [E. coli]: Secondary | ICD-10-CM | POA: Diagnosis present

## 2020-10-01 DIAGNOSIS — Z23 Encounter for immunization: Secondary | ICD-10-CM

## 2020-10-01 DIAGNOSIS — I10 Essential (primary) hypertension: Secondary | ICD-10-CM | POA: Diagnosis present

## 2020-10-01 DIAGNOSIS — R809 Proteinuria, unspecified: Secondary | ICD-10-CM

## 2020-10-01 LAB — RESP PANEL BY RT-PCR (FLU A&B, COVID) ARPGX2
Influenza A by PCR: NEGATIVE
Influenza B by PCR: NEGATIVE
SARS Coronavirus 2 by RT PCR: NEGATIVE

## 2020-10-01 LAB — COMPREHENSIVE METABOLIC PANEL
ALT: 27 U/L (ref 0–44)
AST: 24 U/L (ref 15–41)
Albumin: 3.6 g/dL (ref 3.5–5.0)
Alkaline Phosphatase: 58 U/L (ref 38–126)
Anion gap: 7 (ref 5–15)
BUN: 18 mg/dL (ref 6–20)
CO2: 28 mmol/L (ref 22–32)
Calcium: 8.6 mg/dL — ABNORMAL LOW (ref 8.9–10.3)
Chloride: 98 mmol/L (ref 98–111)
Creatinine, Ser: 0.72 mg/dL (ref 0.44–1.00)
GFR, Estimated: >60 mL/min (ref >60)
Glucose, Bld: 132 mg/dL — ABNORMAL HIGH (ref 70–99)
Potassium: 3.2 mmol/L — ABNORMAL LOW (ref 3.5–5.1)
Sodium: 133 mmol/L — ABNORMAL LOW (ref 135–145)
Total Bilirubin: 0.4 mg/dL (ref 0.3–1.2)
Total Protein: 7.3 g/dL (ref 6.5–8.1)

## 2020-10-01 LAB — CBC WITH DIFFERENTIAL/PLATELET
Abs Immature Granulocytes: 0.04 10*3/uL (ref 0.00–0.07)
Basophils Absolute: 0 10*3/uL (ref 0.0–0.1)
Basophils Relative: 0 %
Eosinophils Absolute: 0 10*3/uL (ref 0.0–0.5)
Eosinophils Relative: 0 %
HCT: 35 % — ABNORMAL LOW (ref 36.0–46.0)
Hemoglobin: 11.7 g/dL — ABNORMAL LOW (ref 12.0–15.0)
Immature Granulocytes: 0 %
Lymphocytes Relative: 10 %
Lymphs Abs: 0.9 10*3/uL (ref 0.7–4.0)
MCH: 29.2 pg (ref 26.0–34.0)
MCHC: 33.4 g/dL (ref 30.0–36.0)
MCV: 87.3 fL (ref 80.0–100.0)
Monocytes Absolute: 0.9 10*3/uL (ref 0.1–1.0)
Monocytes Relative: 10 %
Neutro Abs: 7.3 10*3/uL (ref 1.7–7.7)
Neutrophils Relative %: 80 %
Platelets: 252 10*3/uL (ref 150–400)
RBC: 4.01 MIL/uL (ref 3.87–5.11)
RDW: 13.8 % (ref 11.5–15.5)
WBC: 9.1 10*3/uL (ref 4.0–10.5)
nRBC: 0 % (ref 0.0–0.2)

## 2020-10-01 LAB — URINALYSIS, ROUTINE W REFLEX MICROSCOPIC
Glucose, UA: NEGATIVE mg/dL
Ketones, ur: NEGATIVE mg/dL
Nitrite: POSITIVE — AB
Protein, ur: >300 mg/dL — AB
Specific Gravity, Urine: 1.02 (ref 1.005–1.030)
pH: 7 (ref 5.0–8.0)

## 2020-10-01 LAB — URINALYSIS, MICROSCOPIC (REFLEX): WBC, UA: >50 WBC/hpf (ref 0–5)

## 2020-10-01 LAB — LACTIC ACID, PLASMA
Lactic Acid, Venous: 0.7 mmol/L (ref 0.5–1.9)
Lactic Acid, Venous: 1 mmol/L (ref 0.5–1.9)

## 2020-10-01 LAB — PROTIME-INR
INR: 1 (ref 0.8–1.2)
Prothrombin Time: 13 seconds (ref 11.4–15.2)

## 2020-10-01 LAB — APTT: aPTT: 29 seconds (ref 24–36)

## 2020-10-01 LAB — MRSA NEXT GEN BY PCR, NASAL: MRSA by PCR Next Gen: NOT DETECTED

## 2020-10-01 MED ORDER — ALBUTEROL SULFATE (2.5 MG/3ML) 0.083% IN NEBU: 2.5000 mg | INHALATION_SOLUTION | Freq: Four times a day (QID) | RESPIRATORY_TRACT | Status: DC | PRN

## 2020-10-01 MED ORDER — LORATADINE 10 MG PO TABS
10.0000 mg | ORAL_TABLET | Freq: Every day | ORAL | Status: DC
  Administered 2020-10-02 – 2020-10-03 (×2): 10 mg via ORAL
  Filled 2020-10-01 (×2): qty 1

## 2020-10-01 MED ORDER — LACTATED RINGERS IV BOLUS: 2000.0000 mL | Freq: Once | INTRAVENOUS | Status: DC

## 2020-10-01 MED ORDER — LACTATED RINGERS IV BOLUS (SEPSIS)
1000.0000 mL | Freq: Once | INTRAVENOUS | Status: AC
  Administered 2020-10-01: 1000 mL via INTRAVENOUS

## 2020-10-01 MED ORDER — ONDANSETRON HCL 4 MG/2ML IJ SOLN
4.0000 mg | Freq: Four times a day (QID) | INTRAMUSCULAR | Status: DC | PRN
  Administered 2020-10-01 – 2020-10-02 (×2): 4 mg via INTRAVENOUS
  Filled 2020-10-01 (×2): qty 2

## 2020-10-01 MED ORDER — KETOROLAC TROMETHAMINE 30 MG/ML IJ SOLN
30.0000 mg | Freq: Once | INTRAMUSCULAR | Status: AC
  Administered 2020-10-01: 30 mg via INTRAVENOUS
  Filled 2020-10-01: qty 1

## 2020-10-01 MED ORDER — INFLUENZA VAC SPLIT QUAD 0.5 ML IM SUSY
0.5000 mL | PREFILLED_SYRINGE | INTRAMUSCULAR | Status: AC
  Administered 2020-10-03: 0.5 mL via INTRAMUSCULAR
  Filled 2020-10-01: qty 0.5

## 2020-10-01 MED ORDER — VALACYCLOVIR HCL 500 MG PO TABS
500.0000 mg | ORAL_TABLET | Freq: Every day | ORAL | Status: DC
  Administered 2020-10-02 – 2020-10-03 (×2): 500 mg via ORAL
  Filled 2020-10-01 (×2): qty 1

## 2020-10-01 MED ORDER — AZELASTINE HCL 0.1 % NA SOLN
1.0000 | Freq: Two times a day (BID) | NASAL | Status: DC
  Administered 2020-10-01 – 2020-10-03 (×4): 1 via NASAL
  Filled 2020-10-01: qty 30

## 2020-10-01 MED ORDER — LACTATED RINGERS IV BOLUS (SEPSIS): 1000.0000 mL | Freq: Once | INTRAVENOUS | Status: DC

## 2020-10-01 MED ORDER — ACETAMINOPHEN 325 MG PO TABS
650.0000 mg | ORAL_TABLET | Freq: Four times a day (QID) | ORAL | Status: DC | PRN
  Administered 2020-10-01 – 2020-10-03 (×5): 650 mg via ORAL
  Filled 2020-10-01 (×5): qty 2

## 2020-10-01 MED ORDER — GABAPENTIN 300 MG PO CAPS
300.0000 mg | ORAL_CAPSULE | Freq: Two times a day (BID) | ORAL | Status: DC | PRN
  Administered 2020-10-01 – 2020-10-02 (×2): 300 mg via ORAL
  Filled 2020-10-01 (×2): qty 1

## 2020-10-01 MED ORDER — SODIUM CHLORIDE 0.9 % IV SOLN
2.0000 g | Freq: Three times a day (TID) | INTRAVENOUS | Status: DC
  Administered 2020-10-01: 2 g via INTRAVENOUS
  Filled 2020-10-01 (×2): qty 2

## 2020-10-01 MED ORDER — SODIUM CHLORIDE 0.9 % IV SOLN
2.0000 g | Freq: Once | INTRAVENOUS | Status: AC
  Administered 2020-10-01: 2 g via INTRAVENOUS
  Filled 2020-10-01: qty 2

## 2020-10-01 MED ORDER — PANTOPRAZOLE SODIUM 40 MG PO TBEC
40.0000 mg | DELAYED_RELEASE_TABLET | Freq: Two times a day (BID) | ORAL | Status: DC
  Administered 2020-10-01 – 2020-10-03 (×4): 40 mg via ORAL
  Filled 2020-10-01 (×4): qty 1

## 2020-10-01 MED ORDER — SODIUM CHLORIDE 0.9 % IV SOLN: INTRAVENOUS | Status: DC | PRN

## 2020-10-01 MED ORDER — VANCOMYCIN HCL 750 MG/150ML IV SOLN
750.0000 mg | Freq: Two times a day (BID) | INTRAVENOUS | Status: DC
  Filled 2020-10-01: qty 150

## 2020-10-01 MED ORDER — ENOXAPARIN SODIUM 40 MG/0.4ML IJ SOSY
40.0000 mg | PREFILLED_SYRINGE | INTRAMUSCULAR | Status: DC
  Administered 2020-10-02 – 2020-10-03 (×2): 40 mg via SUBCUTANEOUS
  Filled 2020-10-01 (×2): qty 0.4

## 2020-10-01 MED ORDER — ATORVASTATIN CALCIUM 20 MG PO TABS
20.0000 mg | ORAL_TABLET | Freq: Every day | ORAL | Status: DC
  Administered 2020-10-01 – 2020-10-03 (×3): 20 mg via ORAL
  Filled 2020-10-01 (×3): qty 1

## 2020-10-01 MED ORDER — TRAMADOL HCL 50 MG PO TABS
50.0000 mg | ORAL_TABLET | Freq: Once | ORAL | Status: AC
  Administered 2020-10-01: 50 mg via ORAL
  Filled 2020-10-01: qty 1

## 2020-10-01 MED ORDER — ACETAMINOPHEN 500 MG PO TABS
1000.0000 mg | ORAL_TABLET | Freq: Once | ORAL | Status: AC
  Administered 2020-10-01: 1000 mg via ORAL
  Filled 2020-10-01: qty 2

## 2020-10-01 MED ORDER — POTASSIUM CHLORIDE CRYS ER 20 MEQ PO TBCR
20.0000 meq | EXTENDED_RELEASE_TABLET | Freq: Once | ORAL | Status: AC
  Administered 2020-10-01: 20 meq via ORAL
  Filled 2020-10-01: qty 1

## 2020-10-01 MED ORDER — FLUTICASONE PROPIONATE 50 MCG/ACT NA SUSP
1.0000 | Freq: Every day | NASAL | Status: DC
  Administered 2020-10-01 – 2020-10-03 (×3): 1 via NASAL
  Filled 2020-10-01: qty 16

## 2020-10-01 MED ORDER — SODIUM CHLORIDE 0.9 % IV SOLN
1.0000 g | Freq: Every day | INTRAVENOUS | Status: AC
  Administered 2020-10-01 – 2020-10-03 (×3): 1 g via INTRAVENOUS
  Filled 2020-10-01 (×3): qty 10

## 2020-10-01 MED ORDER — ONDANSETRON HCL 4 MG PO TABS
4.0000 mg | ORAL_TABLET | Freq: Four times a day (QID) | ORAL | Status: DC | PRN
  Administered 2020-10-02 – 2020-10-03 (×2): 4 mg via ORAL
  Filled 2020-10-01 (×2): qty 1

## 2020-10-01 MED ORDER — VANCOMYCIN HCL IN DEXTROSE 1-5 GM/200ML-% IV SOLN
1000.0000 mg | Freq: Once | INTRAVENOUS | Status: AC
  Administered 2020-10-01: 1000 mg via INTRAVENOUS
  Filled 2020-10-01: qty 200

## 2020-10-01 MED ORDER — ACETAMINOPHEN 650 MG RE SUPP: 650.0000 mg | Freq: Four times a day (QID) | RECTAL | Status: DC | PRN

## 2020-10-01 MED ORDER — IBUPROFEN 800 MG PO TABS: 800.0000 mg | ORAL_TABLET | Freq: Once | ORAL | Status: DC

## 2020-10-01 MED ORDER — METRONIDAZOLE 500 MG/100ML IV SOLN
500.0000 mg | Freq: Once | INTRAVENOUS | Status: AC
  Administered 2020-10-01: 500 mg via INTRAVENOUS
  Filled 2020-10-01: qty 100

## 2020-10-01 NOTE — Sepsis Progress Note (Signed)
Monitoring for code sepsis protocol. 

## 2020-10-01 NOTE — ED Triage Notes (Addendum)
Pt states flu like symptoms and pain with urination, emesis, chills. X1 week. Groin pain when sitting.

## 2020-10-01 NOTE — ED Notes (Signed)
ED TO INPATIENT HANDOFF REPORT  ED Nurse Name and Phone #:   S Name/Age/Gender Jessica Glass 55 y.o. female Room/Bed: MH02/MH02  Code Status   Code Status: Not on file  Home/SNF/Other Home Patient oriented to: self, place, time, and situation Is this baseline? Yes   Triage Complete: Triage complete  Chief Complaint Acute pyelonephritis [N10]  Triage Note Pt states flu like symptoms and pain with urination, emesis, chills. X1 week. Groin pain when sitting.    Allergies No Known Allergies  Level of Care/Admitting Diagnosis ED Disposition     ED Disposition  Admit   Condition  --   Comment  Hospital Area: [facility] [[id number]]  Level of Care: Telemetry [5]  Admit to tele based on following criteria: Monitor for Ischemic changes  May admit patient to [facility] or [facility] if equivalent level of care is available:: No  Interfacility transfer: Yes  Covid Evaluation: Asymptomatic Screening Protocol (No Symptoms)  Diagnosis: Acute pyelonephritis [202542]  Admitting Physician: Marinda Elk [[id number]]  Attending Physician: Marinda Elk [[id number]]  Estimated length of stay: 3 - 4 days  Certification:: I certify this patient will need inpatient services for at least 2 midnights          B Medical/Surgery History Past Medical History:  Diagnosis Date   Arrhythmia 05/26/09   ECHO ALL CHAMBERS  ARE NORMAL IN SIZE AND FUNCTION EF70%   Cervical spondylosis 12/20/2017   GERD (gastroesophageal reflux disease)    Hyperlipidemia    Hypertension    Obstructive sleep apnea    currently wearing CPAP   Seasonal allergies    Past Surgical History:  Procedure Laterality Date   ADENOIDECTOMY     BLADDER SUSPENSION     DEBRIDEMENT TENNIS ELBOW     ELBOW SURGERY     bilateral tendon surgery   PATELLA RECONSTRUCTION Left    TONSILLECTOMY       A IV Location/Drains/Wounds Patient Lines/Drains/Airways Status     Active  Line/Drains/Airways     Name Placement date Placement time Site Days   Peripheral IV 10/01/20 20 G Right Antecubital 10/01/20  0253  Antecubital  less than 1   Peripheral IV 10/01/20 Left Antecubital 10/01/20  0320  Antecubital  less than 1            Intake/Output Last 24 hours  Intake/Output Summary (Last 24 hours) at 10/01/2020 1210 Last data filed at 10/01/2020 1040 Gross per 24 hour  Intake 2534.99 ml  Output --  Net 2534.99 ml    Labs/Imaging Results for orders placed or performed during the hospital encounter of 10/01/20 (from the past 48 hour(s))  Resp Panel by RT-PCR (Flu A&B, Covid) Nasopharyngeal Swab     Status: None   Collection Time: 10/01/20  2:27 AM   Specimen: Nasopharyngeal Swab; Nasopharyngeal(NP) swabs in vial transport medium  Result Value Ref Range   SARS Coronavirus 2 by RT PCR NEGATIVE NEGATIVE    Comment: (NOTE) SARS-CoV-2 target nucleic acids are NOT DETECTED.  The SARS-CoV-2 RNA is generally detectable in upper respiratory specimens during the acute phase of infection. The lowest concentration of SARS-CoV-2 viral copies this assay can detect is 138 copies/mL. A negative result does not preclude SARS-Cov-2 infection and should not be used as the sole basis for treatment or other patient management decisions. A negative result may occur with  improper specimen collection/handling, submission of specimen other than nasopharyngeal swab, presence of viral mutation(s) within the areas targeted  by this assay, and inadequate number of viral copies(<138 copies/mL). A negative result must be combined with clinical observations, patient history, and epidemiological information. The expected result is Negative.  Fact Sheet for Patients:  BloggerCourse.com  Fact Sheet for Healthcare Providers:  SeriousBroker.it  This test is no t yet approved or cleared by the [place] FDA and  has been authorized  for detection and/or diagnosis of SARS-CoV-2 by FDA under an Emergency Use Authorization (EUA). This EUA will remain  in effect (meaning this test can be used) for the duration of the COVID-19 declaration under Section 564(b)(1) of the Act, 21 U.S.C.section 360bbb-3(b)(1), unless the authorization is terminated  or revoked sooner.       Influenza A by PCR NEGATIVE NEGATIVE   Influenza B by PCR NEGATIVE NEGATIVE    Comment: (NOTE) The Xpert Xpress SARS-CoV-2/FLU/RSV plus assay is intended as an aid in the diagnosis of influenza from Nasopharyngeal swab specimens and should not be used as a sole basis for treatment. Nasal washings and aspirates are unacceptable for Xpert Xpress SARS-CoV-2/FLU/RSV testing.  Fact Sheet for Patients: BloggerCourse.com  Fact Sheet for Healthcare Providers: SeriousBroker.it  This test is not yet approved or cleared by the [place] FDA and has been authorized for detection and/or diagnosis of SARS-CoV-2 by FDA under an Emergency Use Authorization (EUA). This EUA will remain in effect (meaning this test can be used) for the duration of the COVID-19 declaration under Section 564(b)(1) of the Act, 21 U.S.C. section 360bbb-3(b)(1), unless the authorization is terminated or revoked.  Performed at [facility], [address] [place], [place] [address]   CBC with Differential/Platelet     Status: Abnormal   Collection Time: 10/01/20  2:27 AM  Result Value Ref Range   WBC 9.1 4.0 - 10.5 K/uL   RBC 4.01 3.87 - 5.11 MIL/uL   Hemoglobin 11.7 (L) 12.0 - 15.0 g/dL   HCT 59.5 (L) 63.8 - 75.6 %   MCV 87.3 80.0 - 100.0 fL   MCH 29.2 26.0 - 34.0 pg   MCHC 33.4 30.0 - 36.0 g/dL   RDW 43.3 29.5 - 18.8 %   Platelets 252 150 - 400 K/uL   nRBC 0.0 0.0 - 0.2 %   Neutrophils Relative % 80 %   Neutro Abs 7.3 1.7 - 7.7 K/uL   Lymphocytes Relative 10 %   Lymphs Abs 0.9 0.7 - 4.0 K/uL   Monocytes  Relative 10 %   Monocytes Absolute 0.9 0.1 - 1.0 K/uL   Eosinophils Relative 0 %   Eosinophils Absolute 0.0 0.0 - 0.5 K/uL   Basophils Relative 0 %   Basophils Absolute 0.0 0.0 - 0.1 K/uL   Immature Granulocytes 0 %   Abs Immature Granulocytes 0.04 0.00 - 0.07 K/uL    Comment: Performed at [facility], [address] [place], [place] [address]  Comprehensive metabolic panel     Status: Abnormal   Collection Time: 10/01/20  2:27 AM  Result Value Ref Range   Sodium 133 (L) 135 - 145 mmol/L   Potassium 3.2 (L) 3.5 - 5.1 mmol/L   Chloride 98 98 - 111 mmol/L   CO2 28 22 - 32 mmol/L   Glucose, Bld 132 (H) 70 - 99 mg/dL    Comment: Glucose reference range applies only to samples taken after fasting for at least 8 hours.   BUN 18 6 - 20 mg/dL   Creatinine, Ser 6.30 0.44 - 1.00 mg/dL  Calcium 8.6 (L) 8.9 - 10.3 mg/dL   Total Protein 7.3 6.5 - 8.1 g/dL   Albumin 3.6 3.5 - 5.0 g/dL   AST 24 15 - 41 U/L   ALT 27 0 - 44 U/L   Alkaline Phosphatase 58 38 - 126 U/L   Total Bilirubin 0.4 0.3 - 1.2 mg/dL   GFR, Estimated >27 >25 mL/min    Comment: (NOTE) Calculated using the CKD-EPI Creatinine Equation (2021)    Anion gap 7 5 - 15    Comment: Performed at [facility], [address] [place], [place] [address]  Lactic acid, plasma     Status: None   Collection Time: 10/01/20  2:27 AM  Result Value Ref Range   Lactic Acid, Venous 0.7 0.5 - 1.9 mmol/L    Comment: Performed at [facility], [address] [place], [place] [address]  Protime-INR     Status: None   Collection Time: 10/01/20  2:27 AM  Result Value Ref Range   Prothrombin Time 13.0 11.4 - 15.2 seconds   INR 1.0 0.8 - 1.2    Comment: (NOTE) INR goal varies based on device and disease states. Performed at [facility], [address] [place], [place] [address]   APTT     Status: None   Collection Time: 10/01/20  2:27 AM  Result Value Ref Range   aPTT 29 24 - 36 seconds     Comment: Performed at [facility], [address] [place], [place] [address]  Urinalysis, Routine w reflex microscopic Urine, Clean Catch     Status: Abnormal   Collection Time: 10/01/20  2:27 AM  Result Value Ref Range   Color, Urine YELLOW YELLOW   APPearance CLOUDY (A) CLEAR   Specific Gravity, Urine 1.020 1.005 - 1.030   pH 7.0 5.0 - 8.0   Glucose, UA NEGATIVE NEGATIVE mg/dL   Hgb urine dipstick LARGE (A) NEGATIVE   Bilirubin Urine SMALL (A) NEGATIVE   Ketones, ur NEGATIVE NEGATIVE mg/dL   Protein, ur >433 (A) NEGATIVE mg/dL   Nitrite POSITIVE (A) NEGATIVE   Leukocytes,Ua LARGE (A) NEGATIVE    Comment: Performed at [facility], [address] [place], [place] [address]  Urinalysis, Microscopic (reflex)     Status: Abnormal   Collection Time: 10/01/20  2:27 AM  Result Value Ref Range   RBC / HPF 11-20 0 - 5 RBC/hpf   WBC, UA >50 0 - 5 WBC/hpf   Bacteria, UA MANY (A) NONE SEEN   Squamous Epithelial / LPF 0-5 0 - 5    Comment: Performed at [facility], [address] [place], [place] [address]  Lactic acid, plasma     Status: None   Collection Time: 10/01/20  5:10 AM  Result Value Ref Range   Lactic Acid, Venous 1.0 0.5 - 1.9 mmol/L    Comment: Performed at [facility], [address] [place], [place] [address]   DG Chest Port 1 View  Result Date: 10/01/2020 CLINICAL DATA:  Questionable sepsis. Flu like symptoms. Vomiting and chills. EXAM: PORTABLE CHEST 1 VIEW COMPARISON:  09/22/2014 FINDINGS: The cardiomediastinal contours are normal. The lungs are clear. Pulmonary vasculature is normal. No consolidation, pleural effusion, or pneumothorax. No acute osseous abnormalities are seen. IMPRESSION: No acute chest findings. Electronically Signed   By: Narda Rutherford M.D.   On: 10/01/2020 03:10   CT Renal  Stone Study  Result Date: 10/01/2020 CLINICAL DATA:  Flank pain, kidney stone suspected. Patient reports flu like symptoms. Pain  with urination. Vomiting and chills. Groin pain. EXAM: CT ABDOMEN AND PELVIS WITHOUT CONTRAST TECHNIQUE: Multidetector CT imaging of the abdomen and pelvis was performed following the standard protocol without IV contrast. COMPARISON:  CT 07/31/2016 FINDINGS: Lower chest: No pleural effusion or acute airspace disease. Hepatobiliary: No focal hepatic abnormality on this unenhanced exam. Decompressed gallbladder. No calcified gallstone or biliary dilatation. Pancreas: No ductal dilatation or inflammation. Spleen: Normal in size without focal abnormality. Adrenals/Urinary Tract: Normal adrenal glands. Mild left perinephric edema and prominence of the lower pole calyx and ureter. No renal or ureteric stones are seen. No right hydronephrosis or renal calculi. Right ureter is decompressed. Mild urinary bladder wall thickening. No bladder stone. Stomach/Bowel: Unremarkable stomach. No small bowel obstruction or inflammation. Scattered colonic diverticulosis without diverticulitis. Low lying cecum in the mid pelvis. The appendix is normal, coursing in the midline. Vascular/Lymphatic: Normal caliber abdominal aorta. No portal venous or mesenteric gas. No enlarged lymph nodes in the abdomen or pelvis. Reproductive: Unremarkable CT appearance of the uterus and ovaries. No adnexal mass. Other: Small fat containing umbilical hernia. No ascites or free air. Musculoskeletal: Mild multilevel degenerative disc disease in the lumbar spine. There are no acute or suspicious osseous abnormalities. IMPRESSION: 1. Mild left perinephric edema and prominence of the lower pole calyx and ureter, suspicious for urinary tract infection. No renal or ureteric stones. 2. Mild urinary bladder wall thickening, can be seen with cystitis. 3. Colonic diverticulosis without diverticulitis. 4. Small fat containing umbilical hernia. Electronically Signed   By: Narda Rutherford M.D.   On: 10/01/2020 03:10    Pending Labs Unresulted Labs (From admission,  onward)     Start     Ordered   10/01/20 0342  MRSA Next Gen by PCR, Nasal  Once,   STAT       Question:  Patient immune status  Answer:  Immunocompromised   10/01/20 0343   10/01/20 0304  Urinalysis, Routine w reflex microscopic Urine, Clean Catch  Once,   STAT       Question:  Release to patient  Answer:  Immediate   10/01/20 0303   10/01/20 0219  Urine Culture  (Septic presentation on arrival (screening labs, nursing and treatment orders for obvious sepsis))  ONCE - STAT,   STAT       Question Answer Comment  Indication Dysuria   Patient immune status Normal   Release to patient Immediate      10/01/20 0219   10/01/20 0215  Blood culture (routine x 2)  BLOOD CULTURE X 2,   STAT     Question Answer Comment  Patient immune status Normal   Release to patient Immediate      10/01/20 0214            Vitals/Pain Today's Vitals   10/01/20 0634 10/01/20 0752 10/01/20 0815 10/01/20 0900  BP:  109/69 129/69 128/80  Pulse: 79 92 80 88  Resp:  15 17 14   Temp: 98.8 F (37.1 C) 98.4 F (36.9 C)    TempSrc: Oral Oral    SpO2:  98% 97% 98%  Weight:      Height:      PainSc:        Isolation Precautions Airborne and Contact precautions  Medications Medications  0.9 %  sodium chloride infusion ( Intravenous New Bag/Given 10/01/20 0324)  vancomycin (VANCOREADY) IVPB 750  mg/150 mL (has no administration in time range)  ceFEPIme (MAXIPIME) 2 g in sodium chloride 0.9 % 100 mL IVPB (0 g Intravenous Stopped 10/01/20 1040)  acetaminophen (TYLENOL) tablet 1,000 mg (1,000 mg Oral Given 10/01/20 0302)  ceFEPIme (MAXIPIME) 2 g in sodium chloride 0.9 % 100 mL IVPB (0 g Intravenous Stopped 10/01/20 0402)  metroNIDAZOLE (FLAGYL) IVPB 500 mg (0 mg Intravenous Stopped 10/01/20 0628)  vancomycin (VANCOCIN) IVPB 1000 mg/200 mL premix (0 mg Intravenous Stopped 10/01/20 0515)  lactated ringers bolus 1,000 mL (0 mLs Intravenous Stopped 10/01/20 0404)    And  lactated ringers bolus 1,000 mL (0 mLs  Intravenous Stopped 10/01/20 0504)  ketorolac (TORADOL) 30 MG/ML injection 30 mg (30 mg Intravenous Given 10/01/20 0304)    Mobility walks Low fall risk   Focused Assessments Cardiac Assessment Handoff:    No results found for: CKTOTAL, CKMB, CKMBINDEX, TROPONINI No results found for: DDIMER Does the Patient currently have chest pain? No    R Recommendations: See Admitting Provider Note  Report given to:   Additional Notes:

## 2020-10-01 NOTE — H&P (Signed)
History and Physical  Patient Name: Jessica Glass     HRC:[id number]    DOB: [birth year]    DOA: 10/01/2020 PCP: Charisse March, NP-C  Patient coming from: Home  Chief Complaint: Dysuria, pelvic pain, nausea,    HPI: ALYZAE Glass is a 55 y.o. female, with PMH of hypertension, dyslipidemia, GERD, OSA, seasonal allergies who presented to the ER on 09/30/2020 with dysuria, pelvic pain, nausea, and was found to have pyelonephritis.  Patient states for the past week or so she has had progressive nausea, dysuria, poor p.o. intake, and groin pain.  Her symptoms have been severe, progressive, without improvement.  Thinks she might had some hematuria.  Denies similar symptoms in the past.  She also felt feverish with general malaise.  Denies history of urinary tract infections previously.  Has a history of bladder surgery related to what sounds like a hernia, secondary to pelvic pressure.  Does not have a Foley or have previous issues with urination.    ED course: -Vitals on admission:Temperature 101.2 F, heart rate 119, respiratory rate 26, blood pressure 146/86, maintaining sats on room air -Labs on initial presentation: Sodium 133, potassium 3.2, chloride 98, bicarb 28, glucose 132, BUN 18, creatinine 0.72, calcium 8.6, WBC 9.1, hemoglobin 11.7, COVID-negative, UA consistent with UTI -Imaging obtained on admission: CT abdomen pelvis demonstrated findings concerning for UTI, pyelonephritis -In the ED the patient was given cefepime, Toradol, IV fluids, Flagyl, vancomycin, and the hospitalist service was contacted for further evaluation and management.     ROS: A complete and thorough 12 point review of systems obtained, negative listed in HPI.     Past Medical History:  Diagnosis Date   Arrhythmia 05/26/09   ECHO ALL CHAMBERS  ARE NORMAL IN SIZE AND FUNCTION EF70%   Cervical spondylosis 12/20/2017   GERD (gastroesophageal reflux disease)    Hyperlipidemia    Hypertension     Obstructive sleep apnea    currently wearing CPAP   Seasonal allergies     Past Surgical History:  Procedure Laterality Date   ADENOIDECTOMY     BLADDER SUSPENSION     DEBRIDEMENT TENNIS ELBOW     ELBOW SURGERY     bilateral tendon surgery   PATELLA RECONSTRUCTION Left    TONSILLECTOMY      Social History: Patient lives at home.  The patient walks without assistance.  nonsmoker.  No Known Allergies  Family history: family history includes Breast cancer (age of onset: 36) in her paternal aunt; Cancer in her maternal aunt and mother; Diabetes in her mother; Heart disease in her mother; Hyperlipidemia in her father; Hypertension in her father and mother.  Prior to Admission medications   Medication Sig Start Date End Date Taking? Authorizing Provider  atorvastatin (LIPITOR) 20 MG tablet Take 20 mg by mouth daily.    [provider]  atorvastatin (LIPITOR) 20 MG tablet Take 20 mg by mouth daily.    [provider]  benazepril-hydrochlorthiazide (LOTENSIN HCT) 20-12.5 MG tablet Take 1 tablet by mouth daily.    [provider]  benazepril-hydrochlorthiazide (LOTENSIN HCT) 20-12.5 MG tablet Take 1 tablet by mouth daily.    [provider]  cetirizine (ZYRTEC) 10 MG tablet Take 10 mg by mouth daily.    [provider]  Cholecalciferol (VITAMIN D PO) Take 2 tablets by mouth daily.     [provider]  GABAPENTIN PO Take 300 mg by mouth as needed.     [provider]  pantoprazole (PROTONIX) 40 MG tablet Take 40 mg by mouth 2 (two) times daily.     [provider]  pantoprazole (PROTONIX) 40 MG tablet Take 40 mg by mouth daily.    [provider]  valACYclovir (VALTREX) 500 MG tablet Take 500 mg by mouth 2 (two) times daily.    [provider]  valACYclovir HCl (VALTREX PO) Take 1 tablet by mouth daily.    [provider]       Physical Exam: BP 105/88   Pulse (!) 103   Temp 99.4 F  (37.4 C) (Oral)   Resp 16   Ht 5\' 4"  (1.626 m)   Wt 99 kg   SpO2 100%   BMI 37.46 kg/m   General appearance: Well-developed, adult female, alert and in no acute distress .   Eyes: Anicteric, conjunctiva pink, lids and lashes normal. PERRL.    ENT: No nasal deformity, discharge, epistaxis.  Hearing intact. OP moist without lesions.   Neck: No neck masses.  Trachea midline.  No thyromegaly/tenderness. Lymph: No cervical or supraclavicular lymphadenopathy. Skin: Warm and dry.  No jaundice.  No suspicious rashes or lesions. Cardiac: RRR, nl S1-S2, no murmurs appreciated.  No LE edema.  Radial and pedal pulses 2+ and symmetric. Respiratory: Normal respiratory rate and rhythm.  CTAB without rales or wheezes. Abdomen: Abdomen soft.  No tenderness with palpation. No ascites, distension, hepatosplenomegaly.   MSK: No deformities or effusions of the large joints of the upper or lower extremities bilaterally.  No cyanosis or clubbing. Neuro: Cranial nerves 2 through 12 grossly intact.  Sensation intact to light touch. Speech is fluent.     Psych: Sensorium intact and responding to questions, attention normal.  Behavior appropriate.  Judgment and insight appear normal.    Labs on Admission:  I have personally reviewed following labs and imaging studies: CBC: Recent Labs  Lab 10/01/20 0227  WBC 9.1  NEUTROABS 7.3  HGB 11.7*  HCT 35.0*  MCV 87.3  PLT 252   Basic Metabolic Panel: Recent Labs  Lab 10/01/20 0227  NA 133*  K 3.2*  CL 98  CO2 28  GLUCOSE 132*  BUN 18  CREATININE 0.72  CALCIUM 8.6*   GFR: Estimated Creatinine Clearance: 89.7 mL/min (by C-G formula based on SCr of 0.72 mg/dL).  Liver Function Tests: Recent Labs  Lab 10/01/20 0227  AST 24  ALT 27  ALKPHOS 58  BILITOT 0.4  PROT 7.3  ALBUMIN 3.6   No results for input(s): LIPASE, AMYLASE in the last 168 hours. No results for input(s): AMMONIA in the last 168 hours. Coagulation Profile: Recent Labs  Lab  10/01/20 0227  INR 1.0   Cardiac Enzymes: No results for input(s): CKTOTAL, CKMB, CKMBINDEX, TROPONINI in the last 168 hours. BNP (last 3 results) No results for input(s): PROBNP in the last 8760 hours. HbA1C: No results for input(s): HGBA1C in the last 72 hours. CBG: No results for input(s): GLUCAP in the last 168 hours. Lipid Profile: No results for input(s): CHOL, HDL, LDLCALC, TRIG, CHOLHDL, LDLDIRECT in the last 72 hours. Thyroid Function Tests: No results for input(s): TSH, T4TOTAL, FREET4, T3FREE, THYROIDAB in the last 72 hours. Anemia Panel: No results for input(s): VITAMINB12, FOLATE, FERRITIN, TIBC, IRON, RETICCTPCT in the last 72 hours.   Recent Results (from the past 240 hour(s))  Resp Panel by RT-PCR (Flu A&B, Covid) Nasopharyngeal Swab     Status: None   Collection Time: 10/01/20  2:27 AM   Specimen:  Nasopharyngeal Swab; Nasopharyngeal(NP) swabs in vial transport medium  Result Value Ref Range Status   SARS Coronavirus 2 by RT PCR NEGATIVE NEGATIVE Final    Comment: (NOTE) SARS-CoV-2 target nucleic acids are NOT DETECTED.  The SARS-CoV-2 RNA is generally detectable in upper respiratory specimens during the acute phase of infection. The lowest concentration of SARS-CoV-2 viral copies this assay can detect is 138 copies/mL. A negative result does not preclude SARS-Cov-2 infection and should not be used as the sole basis for treatment or other patient management decisions. A negative result may occur with  improper specimen collection/handling, submission of specimen other than nasopharyngeal swab, presence of viral mutation(s) within the areas targeted by this assay, and inadequate number of viral copies(<138 copies/mL). A negative result must be combined with clinical observations, patient history, and epidemiological information. The expected result is Negative.  Fact Sheet for Patients:  BloggerCourse.com  Fact Sheet for Healthcare  Providers:  SeriousBroker.it  This test is no t yet approved or cleared by the [place] FDA and  has been authorized for detection and/or diagnosis of SARS-CoV-2 by FDA under an Emergency Use Authorization (EUA). This EUA will remain  in effect (meaning this test can be used) for the duration of the COVID-19 declaration under Section 564(b)(1) of the Act, 21 U.S.C.section 360bbb-3(b)(1), unless the authorization is terminated  or revoked sooner.       Influenza A by PCR NEGATIVE NEGATIVE Final   Influenza B by PCR NEGATIVE NEGATIVE Final    Comment: (NOTE) The Xpert Xpress SARS-CoV-2/FLU/RSV plus assay is intended as an aid in the diagnosis of influenza from Nasopharyngeal swab specimens and should not be used as a sole basis for treatment. Nasal washings and aspirates are unacceptable for Xpert Xpress SARS-CoV-2/FLU/RSV testing.  Fact Sheet for Patients: BloggerCourse.com  Fact Sheet for Healthcare Providers: SeriousBroker.it  This test is not yet approved or cleared by the [place] FDA and has been authorized for detection and/or diagnosis of SARS-CoV-2 by FDA under an Emergency Use Authorization (EUA). This EUA will remain in effect (meaning this test can be used) for the duration of the COVID-19 declaration under Section 564(b)(1) of the Act, 21 U.S.C. section 360bbb-3(b)(1), unless the authorization is terminated or revoked.  Performed at [facility], [address] [place], [place] [address]            Radiological Exams on Admission: Personally reviewed imaging which shows: CT demonstrated findings concerning for UTI, pyelonephritis.  DG Chest Port 1 View  Result Date: 10/01/2020 CLINICAL DATA:  Questionable sepsis. Flu like symptoms. Vomiting and chills. EXAM: PORTABLE CHEST 1 VIEW COMPARISON:  09/22/2014 FINDINGS: The cardiomediastinal contours are normal. The  lungs are clear. Pulmonary vasculature is normal. No consolidation, pleural effusion, or pneumothorax. No acute osseous abnormalities are seen. IMPRESSION: No acute chest findings. Electronically Signed   By: Narda Rutherford M.D.   On: 10/01/2020 03:10   CT Renal Stone Study  Result Date: 10/01/2020 CLINICAL DATA:  Flank pain, kidney stone suspected. Patient reports flu like symptoms. Pain with urination. Vomiting and chills. Groin pain. EXAM: CT ABDOMEN AND PELVIS WITHOUT CONTRAST TECHNIQUE: Multidetector CT imaging of the abdomen and pelvis was performed following the standard protocol without IV contrast. COMPARISON:  CT 07/31/2016 FINDINGS: Lower chest: No pleural effusion or acute airspace disease. Hepatobiliary: No focal hepatic abnormality on this unenhanced exam. Decompressed gallbladder. No calcified gallstone or biliary dilatation. Pancreas: No ductal dilatation or inflammation. Spleen: Normal in size without focal  abnormality. Adrenals/Urinary Tract: Normal adrenal glands. Mild left perinephric edema and prominence of the lower pole calyx and ureter. No renal or ureteric stones are seen. No right hydronephrosis or renal calculi. Right ureter is decompressed. Mild urinary bladder wall thickening. No bladder stone. Stomach/Bowel: Unremarkable stomach. No small bowel obstruction or inflammation. Scattered colonic diverticulosis without diverticulitis. Low lying cecum in the mid pelvis. The appendix is normal, coursing in the midline. Vascular/Lymphatic: Normal caliber abdominal aorta. No portal venous or mesenteric gas. No enlarged lymph nodes in the abdomen or pelvis. Reproductive: Unremarkable CT appearance of the uterus and ovaries. No adnexal mass. Other: Small fat containing umbilical hernia. No ascites or free air. Musculoskeletal: Mild multilevel degenerative disc disease in the lumbar spine. There are no acute or suspicious osseous abnormalities. IMPRESSION: 1. Mild left perinephric edema and  prominence of the lower pole calyx and ureter, suspicious for urinary tract infection. No renal or ureteric stones. 2. Mild urinary bladder wall thickening, can be seen with cystitis. 3. Colonic diverticulosis without diverticulitis. 4. Small fat containing umbilical hernia. Electronically Signed   By: Narda Rutherford M.D.   On: 10/01/2020 03:10        Assessment/Plan   1.  Sepsis secondary to pyelonephritis, UTI -On admission febrile, tachycardic - UA consistent with UTI - CT imaging concerning for urinary tract infection, pyelonephritis -Urine culture pending - Blood cultures pending - In the ED received vancomycin, cefepime, Flagyl.  Will de-escalate to Rocephin only has no previous history of UTIs or MRSA risk factors   2.  Pyelonephritis -See plans above  3.  General malaise, nausea -Likely secondary to sepsis/UTI -Advance diet as tolerated - See plans above  4.  Essential hypertension -We will hold home blood pressure medications for now given sepsis, blood pressure normal - Continue close monitoring  5.  GERD -Resume home PPI  6.  Dyslipidemia -Resume home statin  7.  Hypokalemia - Potassium 3.2 on admission - Supplementation ordered - Follow-up labs ordered    DVT prophylaxis: Lovenox Code Status: Full Family Communication: None Disposition Plan: Anticipate discharge home when medically optimized Consults called: None Admission status: Inpatient      Medical decision making: Patient seen at 1:49 PM on 10/01/2020.   What exists of the patient's chart was reviewed in depth and summarized above.  Clinical condition: Fair.        Laqueta Due [facility] Please page though AMION or Epic secure chat:  For password, contact charge nurse

## 2020-10-01 NOTE — ED Provider Notes (Signed)
[facility] EMERGENCY DEPARTMENT Provider Note   CSN: [id number] Arrival date & time: 10/01/20  0153     History Chief Complaint  Patient presents with  . Dysuria  . Abdominal Pain    Jessica Glass is a 55 y.o. female.  The history is provided by the patient.  Dysuria Pain quality:  Burning Pain severity:  Severe Onset quality:  Gradual Duration:  1 week Timing:  Constant Progression:  Unchanged Chronicity:  New Recent urinary tract infections: no   Relieved by:  Nothing Worsened by:  Nothing Ineffective treatments:  None tried Urinary symptoms: discolored urine   Associated symptoms: abdominal pain, fever, nausea and vomiting   Associated symptoms comment:  Groin  Risk factors: not pregnant   Abdominal Pain Pain location: groin. Pain quality: not burning   Pain radiates to:  Does not radiate Pain severity:  Severe Onset quality:  Sudden Duration:  1 week Timing:  Constant Progression:  Unchanged Chronicity:  New Context: not alcohol use   Relieved by:  Nothing Worsened by:  Nothing Associated symptoms: dysuria, fever, nausea and vomiting   Associated symptoms: no shortness of breath   Associated symptoms comment:  Body aches and fever      Past Medical History:  Diagnosis Date  . Arrhythmia 05/26/09   ECHO ALL CHAMBERS  ARE NORMAL IN SIZE AND FUNCTION EF70%  . Cervical spondylosis 12/20/2017  . GERD (gastroesophageal reflux disease)   . Hyperlipidemia   . Hypertension   . Obstructive sleep apnea    currently wearing CPAP  . Seasonal allergies     Patient Active Problem List   Diagnosis Date Noted  . Chest pain 01/02/2018  . Obstructive sleep apnea 04/10/2013  . Hypertension   . Hyperlipidemia   . BACK PAIN 02/28/2007  . GERD 08/21/2006  . Essential hypertension 08/15/2006  . ALLERGIC RHINITIS 08/15/2006    Past Surgical History:  Procedure Laterality Date  . ADENOIDECTOMY    . BLADDER SUSPENSION    . DEBRIDEMENT TENNIS ELBOW     . ELBOW SURGERY     bilateral tendon surgery  . PATELLA RECONSTRUCTION Left   . TONSILLECTOMY       OB History   No obstetric history on file.     Family History  Problem Relation Age of Onset  . Cancer Mother        ovarian  . Hypertension Mother   . Diabetes Mother   . Heart disease Mother   . Hyperlipidemia Father   . Hypertension Father   . Cancer Maternal Aunt        breast  . Breast cancer Paternal Aunt 33    Social History   Tobacco Use  . Smoking status: Former    Packs/day: 0.25    Types: Cigarettes    Quit date: 06/06/2007    Years since quitting: 13.3  . Smokeless tobacco: Never  Vaping Use  . Vaping Use: Never used  Substance Use Topics  . Alcohol use: No  . Drug use: No    Home Medications Prior to Admission medications   Medication Sig Start Date End Date Taking? Authorizing Provider  atorvastatin (LIPITOR) 20 MG tablet Take 20 mg by mouth daily.    [provider]  atorvastatin (LIPITOR) 20 MG tablet Take 20 mg by mouth daily.    [provider]  benazepril-hydrochlorthiazide (LOTENSIN HCT) 20-12.5 MG tablet Take 1 tablet by mouth daily.    [provider]  benazepril-hydrochlorthiazide (LOTENSIN HCT)  20-12.5 MG tablet Take 1 tablet by mouth daily.    [provider]  cetirizine (ZYRTEC) 10 MG tablet Take 10 mg by mouth daily.    [provider]  Cholecalciferol (VITAMIN D PO) Take 2 tablets by mouth daily.     [provider]  GABAPENTIN PO Take 300 mg by mouth as needed.     [provider]  pantoprazole (PROTONIX) 40 MG tablet Take 40 mg by mouth 2 (two) times daily.     [provider]  pantoprazole (PROTONIX) 40 MG tablet Take 40 mg by mouth daily.    [provider]  valACYclovir (VALTREX) 500 MG tablet Take 500 mg by mouth 2 (two) times daily.    [provider]  valACYclovir HCl (VALTREX PO) Take 1 tablet by mouth daily.    [provider]     Allergies    Patient has no known allergies.  Review of Systems   Review of Systems  Constitutional:  Positive for fever.  HENT:  Negative for facial swelling.   Eyes:  Negative for redness.  Respiratory:  Negative for shortness of breath.   Cardiovascular:  Negative for leg swelling.  Gastrointestinal:  Positive for abdominal pain, nausea and vomiting.  Genitourinary:  Positive for dysuria.  Musculoskeletal:  Positive for myalgias. Negative for neck stiffness.  Skin:  Negative for rash.  Neurological:  Negative for facial asymmetry.  Psychiatric/Behavioral:  Negative for agitation.   All other systems reviewed and are negative.  Physical Exam Updated Vital Signs BP 122/69 (BP Location: Left Arm)   Pulse 92   Temp 100.3 F (37.9 C) (Oral)   Resp (!) 21   Ht 5\' 4"  (1.626 m)   Wt 95.3 kg   SpO2 96%   BMI 36.05 kg/m   Physical Exam Vitals and nursing note reviewed.  Constitutional:      General: She is not in acute distress.    Appearance: Normal appearance.  HENT:     Head: Normocephalic and atraumatic.     Nose: Nose normal.  Eyes:     Conjunctiva/sclera: Conjunctivae normal.     Pupils: Pupils are equal, round, and reactive to light.  Cardiovascular:     Rate and Rhythm: Regular rhythm. Tachycardia present.     Pulses: Normal pulses.     Heart sounds: Normal heart sounds.  Pulmonary:     Effort: Pulmonary effort is normal.     Breath sounds: Normal breath sounds.  Abdominal:     General: Abdomen is flat. Bowel sounds are normal.     Palpations: Abdomen is soft.     Tenderness: There is no abdominal tenderness. There is no guarding.  Musculoskeletal:        General: Normal range of motion.     Cervical back: Normal range of motion and neck supple.  Skin:    General: Skin is warm and dry.     Capillary Refill: Capillary refill takes less than 2 seconds.  Neurological:     General: No focal deficit present.     Mental Status: She is alert and oriented  to person, place, and time.     Deep Tendon Reflexes: Reflexes normal.  Psychiatric:        Mood and Affect: Mood normal.        Behavior: Behavior normal.    ED Results / Procedures / Treatments   Labs (all labs ordered are listed, but only abnormal results are displayed) Results for orders placed  or performed during the hospital encounter of 10/01/20  Resp Panel by RT-PCR (Flu A&B, Covid) Nasopharyngeal Swab   Specimen: Nasopharyngeal Swab; Nasopharyngeal(NP) swabs in vial transport medium  Result Value Ref Range   SARS Coronavirus 2 by RT PCR NEGATIVE NEGATIVE   Influenza A by PCR NEGATIVE NEGATIVE   Influenza B by PCR NEGATIVE NEGATIVE  CBC with Differential/Platelet  Result Value Ref Range   WBC 9.1 4.0 - 10.5 K/uL   RBC 4.01 3.87 - 5.11 MIL/uL   Hemoglobin 11.7 (L) 12.0 - 15.0 g/dL   HCT 97.6 (L) 73.4 - 19.3 %   MCV 87.3 80.0 - 100.0 fL   MCH 29.2 26.0 - 34.0 pg   MCHC 33.4 30.0 - 36.0 g/dL   RDW 79.0 24.0 - 97.3 %   Platelets 252 150 - 400 K/uL   nRBC 0.0 0.0 - 0.2 %   Neutrophils Relative % 80 %   Neutro Abs 7.3 1.7 - 7.7 K/uL   Lymphocytes Relative 10 %   Lymphs Abs 0.9 0.7 - 4.0 K/uL   Monocytes Relative 10 %   Monocytes Absolute 0.9 0.1 - 1.0 K/uL   Eosinophils Relative 0 %   Eosinophils Absolute 0.0 0.0 - 0.5 K/uL   Basophils Relative 0 %   Basophils Absolute 0.0 0.0 - 0.1 K/uL   Immature Granulocytes 0 %   Abs Immature Granulocytes 0.04 0.00 - 0.07 K/uL  Comprehensive metabolic panel  Result Value Ref Range   Sodium 133 (L) 135 - 145 mmol/L   Potassium 3.2 (L) 3.5 - 5.1 mmol/L   Chloride 98 98 - 111 mmol/L   CO2 28 22 - 32 mmol/L   Glucose, Bld 132 (H) 70 - 99 mg/dL   BUN 18 6 - 20 mg/dL   Creatinine, Ser 5.32 0.44 - 1.00 mg/dL   Calcium 8.6 (L) 8.9 - 10.3 mg/dL   Total Protein 7.3 6.5 - 8.1 g/dL   Albumin 3.6 3.5 - 5.0 g/dL   AST 24 15 - 41 U/L   ALT 27 0 - 44 U/L   Alkaline Phosphatase 58 38 - 126 U/L   Total Bilirubin 0.4 0.3 - 1.2 mg/dL   GFR,  Estimated >99 >24 mL/min   Anion gap 7 5 - 15  Lactic acid, plasma  Result Value Ref Range   Lactic Acid, Venous 0.7 0.5 - 1.9 mmol/L  Protime-INR  Result Value Ref Range   Prothrombin Time 13.0 11.4 - 15.2 seconds   INR 1.0 0.8 - 1.2  APTT  Result Value Ref Range   aPTT 29 24 - 36 seconds  Urinalysis, Routine w reflex microscopic Urine, Clean Catch  Result Value Ref Range   Color, Urine YELLOW YELLOW   APPearance CLOUDY (A) CLEAR   Specific Gravity, Urine 1.020 1.005 - 1.030   pH 7.0 5.0 - 8.0   Glucose, UA NEGATIVE NEGATIVE mg/dL   Hgb urine dipstick LARGE (A) NEGATIVE   Bilirubin Urine SMALL (A) NEGATIVE   Ketones, ur NEGATIVE NEGATIVE mg/dL   Protein, ur >268 (A) NEGATIVE mg/dL   Nitrite POSITIVE (A) NEGATIVE   Leukocytes,Ua LARGE (A) NEGATIVE  Urinalysis, Microscopic (reflex)  Result Value Ref Range   RBC / HPF 11-20 0 - 5 RBC/hpf   WBC, UA >50 0 - 5 WBC/hpf   Bacteria, UA MANY (A) NONE SEEN   Squamous Epithelial / LPF 0-5 0 - 5   DG Chest Port 1 View  Result Date: 10/01/2020 CLINICAL DATA:  Questionable sepsis. Flu  like symptoms. Vomiting and chills. EXAM: PORTABLE CHEST 1 VIEW COMPARISON:  09/22/2014 FINDINGS: The cardiomediastinal contours are normal. The lungs are clear. Pulmonary vasculature is normal. No consolidation, pleural effusion, or pneumothorax. No acute osseous abnormalities are seen. IMPRESSION: No acute chest findings. Electronically Signed   By: Narda Rutherford M.D.   On: 10/01/2020 03:10   CT Renal Stone Study  Result Date: 10/01/2020 CLINICAL DATA:  Flank pain, kidney stone suspected. Patient reports flu like symptoms. Pain with urination. Vomiting and chills. Groin pain. EXAM: CT ABDOMEN AND PELVIS WITHOUT CONTRAST TECHNIQUE: Multidetector CT imaging of the abdomen and pelvis was performed following the standard protocol without IV contrast. COMPARISON:  CT 07/31/2016 FINDINGS: Lower chest: No pleural effusion or acute airspace disease. Hepatobiliary:  No focal hepatic abnormality on this unenhanced exam. Decompressed gallbladder. No calcified gallstone or biliary dilatation. Pancreas: No ductal dilatation or inflammation. Spleen: Normal in size without focal abnormality. Adrenals/Urinary Tract: Normal adrenal glands. Mild left perinephric edema and prominence of the lower pole calyx and ureter. No renal or ureteric stones are seen. No right hydronephrosis or renal calculi. Right ureter is decompressed. Mild urinary bladder wall thickening. No bladder stone. Stomach/Bowel: Unremarkable stomach. No small bowel obstruction or inflammation. Scattered colonic diverticulosis without diverticulitis. Low lying cecum in the mid pelvis. The appendix is normal, coursing in the midline. Vascular/Lymphatic: Normal caliber abdominal aorta. No portal venous or mesenteric gas. No enlarged lymph nodes in the abdomen or pelvis. Reproductive: Unremarkable CT appearance of the uterus and ovaries. No adnexal mass. Other: Small fat containing umbilical hernia. No ascites or free air. Musculoskeletal: Mild multilevel degenerative disc disease in the lumbar spine. There are no acute or suspicious osseous abnormalities. IMPRESSION: 1. Mild left perinephric edema and prominence of the lower pole calyx and ureter, suspicious for urinary tract infection. No renal or ureteric stones. 2. Mild urinary bladder wall thickening, can be seen with cystitis. 3. Colonic diverticulosis without diverticulitis. 4. Small fat containing umbilical hernia. Electronically Signed   By: Narda Rutherford M.D.   On: 10/01/2020 03:10    EKG EKG Interpretation  Date/Time:  Saturday October 01 2020 03:03:30 EDT Ventricular Rate:  113 PR Interval:  153 QRS Duration: 83 QT Interval:  303 QTC Calculation: 416 R Axis:   57 Text Interpretation: Sinus tachycardia Atrial premature complex Confirmed by Berry Gallacher ([id number]) on 10/01/2020 3:06:40 AM  Radiology DG Chest Port 1 View  Result Date:  10/01/2020 CLINICAL DATA:  Questionable sepsis. Flu like symptoms. Vomiting and chills. EXAM: PORTABLE CHEST 1 VIEW COMPARISON:  09/22/2014 FINDINGS: The cardiomediastinal contours are normal. The lungs are clear. Pulmonary vasculature is normal. No consolidation, pleural effusion, or pneumothorax. No acute osseous abnormalities are seen. IMPRESSION: No acute chest findings. Electronically Signed   By: Narda Rutherford M.D.   On: 10/01/2020 03:10   CT Renal Stone Study  Result Date: 10/01/2020 CLINICAL DATA:  Flank pain, kidney stone suspected. Patient reports flu like symptoms. Pain with urination. Vomiting and chills. Groin pain. EXAM: CT ABDOMEN AND PELVIS WITHOUT CONTRAST TECHNIQUE: Multidetector CT imaging of the abdomen and pelvis was performed following the standard protocol without IV contrast. COMPARISON:  CT 07/31/2016 FINDINGS: Lower chest: No pleural effusion or acute airspace disease. Hepatobiliary: No focal hepatic abnormality on this unenhanced exam. Decompressed gallbladder. No calcified gallstone or biliary dilatation. Pancreas: No ductal dilatation or inflammation. Spleen: Normal in size without focal abnormality. Adrenals/Urinary Tract: Normal adrenal glands. Mild left perinephric edema and prominence of the lower pole calyx and  ureter. No renal or ureteric stones are seen. No right hydronephrosis or renal calculi. Right ureter is decompressed. Mild urinary bladder wall thickening. No bladder stone. Stomach/Bowel: Unremarkable stomach. No small bowel obstruction or inflammation. Scattered colonic diverticulosis without diverticulitis. Low lying cecum in the mid pelvis. The appendix is normal, coursing in the midline. Vascular/Lymphatic: Normal caliber abdominal aorta. No portal venous or mesenteric gas. No enlarged lymph nodes in the abdomen or pelvis. Reproductive: Unremarkable CT appearance of the uterus and ovaries. No adnexal mass. Other: Small fat containing umbilical hernia. No ascites  or free air. Musculoskeletal: Mild multilevel degenerative disc disease in the lumbar spine. There are no acute or suspicious osseous abnormalities. IMPRESSION: 1. Mild left perinephric edema and prominence of the lower pole calyx and ureter, suspicious for urinary tract infection. No renal or ureteric stones. 2. Mild urinary bladder wall thickening, can be seen with cystitis. 3. Colonic diverticulosis without diverticulitis. 4. Small fat containing umbilical hernia. Electronically Signed   By: Narda Rutherford M.D.   On: 10/01/2020 03:10    Procedures Procedures   Medications Ordered in ED Medications  metroNIDAZOLE (FLAGYL) IVPB 500 mg (has no administration in time range)  vancomycin (VANCOCIN) IVPB 1000 mg/200 mL premix (1,000 mg Intravenous New Bag/Given 10/01/20 0407)  0.9 %  sodium chloride infusion ( Intravenous New Bag/Given 10/01/20 0324)  vancomycin (VANCOREADY) IVPB 750 mg/150 mL (has no administration in time range)  ceFEPIme (MAXIPIME) 2 g in sodium chloride 0.9 % 100 mL IVPB (has no administration in time range)  acetaminophen (TYLENOL) tablet 1,000 mg (1,000 mg Oral Given 10/01/20 0302)  ceFEPIme (MAXIPIME) 2 g in sodium chloride 0.9 % 100 mL IVPB (0 g Intravenous Stopped 10/01/20 0402)  lactated ringers bolus 1,000 mL (0 mLs Intravenous Stopped 10/01/20 0404)    And  lactated ringers bolus 1,000 mL (0 mLs Intravenous Stopped 10/01/20 0504)  ketorolac (TORADOL) 30 MG/ML injection 30 mg (30 mg Intravenous Given 10/01/20 0304)    ED Course  I have reviewed the triage vital signs and the nursing notes.  Pertinent labs & imaging results that were available during my care of the patient were reviewed by me and considered in my medical decision making (see chart for details). MDM   Amount and/or Complexity of Data Reviewed Clinical lab tests: ordered and reviewed Discussion of test results with the performing providers: yes Decide to obtain previous medical records or to obtain  history from someone other than the patient: yes Obtain history from someone other than the patient: yes Review and summarize past medical records: yes Independent visualization of images, tracings, or specimens: yes  Risk of Complications, Morbidity, and/or Mortality Presenting problems: high Diagnostic procedures: high Management options: high  Critical Care Total time providing critical care: 30-74 minutes (Sepsis antibiotics and multiple IV boluses)  MDM Reviewed: nursing note, vitals and previous chart Interpretation: labs, ECG, x-ray and CT scan Total time providing critical care: 30-74 minutes (Sepsis antibiotics and multiple IV boluses). This excludes time spent performing separately reportable procedures and services. Consults: admitting MD    Final Clinical Impression(s) / ED Diagnoses Final diagnoses:  Proteinuria, unspecified type  Lower urinary tract infectious disease  SIRS (systemic inflammatory response syndrome) (HCC)   Admit to medicine  Rx / DC Orders ED Discharge Orders     None        Braylea Brancato, MD 10/01/20 2542

## 2020-10-01 NOTE — ED Notes (Signed)
Attempted to call report x2, first time was advised nurse needed to call back, 2nd time no answer on floor, will reattempt.

## 2020-10-01 NOTE — Progress Notes (Signed)
Pharmacy Antibiotic Note  Jessica Glass is a 55 y.o. female admitted on 10/01/2020 with sepsis.  Pharmacy has been consulted for vanc/cefepime dosing.  Pt presented with flu like symptoms and pain with urination x 1 week. Vanc/cefepime started for sepsis.   Plan: Vanc 1g IV x1 then 750mg  IV q12>>AUC 520, scr 0.8 Cefepime 2g IV q8 Level as needed MRSA PCR  Height: 5\' 4"  (162.6 cm) Weight: 95.3 kg (210 lb) IBW/kg (Calculated) : 54.7  Temp (24hrs), Avg:101.2 F (38.4 C), Min:101.2 F (38.4 C), Max:101.2 F (38.4 C)  Recent Labs  Lab 10/01/20 0227  WBC 9.1  CREATININE 0.72  LATICACIDVEN 0.7    Estimated Creatinine Clearance: 87.9 mL/min (by C-G formula based on SCr of 0.72 mg/dL).    No Known Allergies  Antimicrobials this admission: 9/24 cefepime>> 9/24 vanc>>  Dose adjustments this admission:  Microbiology results: 9/24 blood>> 9/24 urine>>  10/24, PharmD, [place], [id number], CPP Infectious Disease Pharmacist 10/01/2020 3:47 AM

## 2020-10-02 DIAGNOSIS — E782 Mixed hyperlipidemia: Secondary | ICD-10-CM

## 2020-10-02 LAB — COMPREHENSIVE METABOLIC PANEL
ALT: 24 U/L (ref 0–44)
AST: 19 U/L (ref 15–41)
Albumin: 3.1 g/dL — ABNORMAL LOW (ref 3.5–5.0)
Alkaline Phosphatase: 44 U/L (ref 38–126)
Anion gap: 8 (ref 5–15)
BUN: 13 mg/dL (ref 6–20)
CO2: 27 mmol/L (ref 22–32)
Calcium: 8.8 mg/dL — ABNORMAL LOW (ref 8.9–10.3)
Chloride: 108 mmol/L (ref 98–111)
Creatinine, Ser: 0.71 mg/dL (ref 0.44–1.00)
GFR, Estimated: >60 mL/min (ref >60)
Glucose, Bld: 124 mg/dL — ABNORMAL HIGH (ref 70–99)
Potassium: 3.5 mmol/L (ref 3.5–5.1)
Sodium: 143 mmol/L (ref 135–145)
Total Bilirubin: 0.5 mg/dL (ref 0.3–1.2)
Total Protein: 6.2 g/dL — ABNORMAL LOW (ref 6.5–8.1)

## 2020-10-02 LAB — PHOSPHORUS: Phosphorus: 3 mg/dL (ref 2.5–4.6)

## 2020-10-02 LAB — CBC
HCT: 33.1 % — ABNORMAL LOW (ref 36.0–46.0)
Hemoglobin: 10.8 g/dL — ABNORMAL LOW (ref 12.0–15.0)
MCH: 29.2 pg (ref 26.0–34.0)
MCHC: 32.6 g/dL (ref 30.0–36.0)
MCV: 89.5 fL (ref 80.0–100.0)
Platelets: 202 10*3/uL (ref 150–400)
RBC: 3.7 MIL/uL — ABNORMAL LOW (ref 3.87–5.11)
RDW: 13.9 % (ref 11.5–15.5)
WBC: 6 10*3/uL (ref 4.0–10.5)
nRBC: 0 % (ref 0.0–0.2)

## 2020-10-02 LAB — HIV ANTIBODY (ROUTINE TESTING W REFLEX): HIV Screen 4th Generation wRfx: NONREACTIVE

## 2020-10-02 LAB — MAGNESIUM: Magnesium: 2.2 mg/dL (ref 1.7–2.4)

## 2020-10-02 MED ORDER — BENAZEPRIL HCL 10 MG PO TABS
20.0000 mg | ORAL_TABLET | Freq: Every day | ORAL | Status: DC
  Administered 2020-10-03: 20 mg via ORAL
  Filled 2020-10-02: qty 2

## 2020-10-02 MED ORDER — IBUPROFEN 200 MG PO TABS
400.0000 mg | ORAL_TABLET | ORAL | Status: DC | PRN
  Administered 2020-10-02 (×2): 400 mg via ORAL
  Filled 2020-10-02 (×2): qty 2

## 2020-10-02 NOTE — Progress Notes (Signed)
PROGRESS NOTE    Jessica Glass   KDT:[id number]  DOB: 07/14/1965  DOA: 10/01/2020 PCP: Charisse March, NP-C   Brief Narrative:  Jessica Glass is a 55 y.o. female, with PMH of hypertension, dyslipidemia, GERD, OSA, seasonal allergies who presented to the ER on 09/30/2020 with dysuria, pelvic pain, nausea and vomiting and was noticed with pyelonephritis.  She states she has never had a urinary traction infection in the past. In the ED: CT renal stone study revealed mild left perinephric edema and prominence of the lower pole calyx and ureter, mild urinary bladder wall thickening, colonic diverticulosis and small fat-containing umbilical hernia. Temperature was 101.5, heart rate was greater than 100, respiratory rate was greater than 25 WBC count was normal - UA was consistent with a UTI  She was initially treated with vancomycin, cefepime and Flagyl.  She was subsequently narrowed down to ceftriaxone  Subjective: She states that the pain with urination is resolving.  She has a headache and she remains nauseated but has not vomited today.  No abdominal pain.    Assessment & Plan:   Principal Problem: Acute pyelonephritis, acute cystitis with sepsis present on admission -Suprapubic pain and dysuria is resolving -Recommend at least 1 more night in the hospital-continue ceftriaxone and follow fever curve - Blood cultures negative so far - Follow-up on urine culture  Active Problems: Nausea and vomiting - Secondary to above infection-she remains nauseated today - Continue as needed antiemetics and follow oral intake  Hypokalemia -Secondary to poor oral intake, vomiting and HCTZ use - Has been replaced    Essential hypertension -Continue to hold HCTZ untill oral intake improves and is stable - Can resume benazepril tomorrow morning    GERD -Continue PPI    Hyperlipidemia Continue a atorvastatin  Morbid obesity Body mass index is 37.46 kg/m.    Time spent in minutes:  35 DVT prophylaxis: enoxaparin (LOVENOX) injection 40 mg Start: 10/02/20 1000  Code Status: Full code Family Communication: Sister over the phone Level of Care: Level of care: Telemetry Disposition Plan:  Status is: Inpatient  Remains inpatient appropriate because:IV treatments appropriate due to intensity of illness or inability to take PO  Dispo: The patient is from: Home              Anticipated d/c is to: Home              Patient currently is not medically stable to d/c.   Difficult to place patient No      Consultants:  None Procedures:  None Antimicrobials:  Anti-infectives (From admission, onward)    Start     Dose/Rate Route Frequency Ordered Stop   10/02/20 1000  valACYclovir (VALTREX) tablet 500 mg        500 mg Oral Daily 10/01/20 1503     10/01/20 1600  vancomycin (VANCOREADY) IVPB 750 mg/150 mL  Status:  Discontinued        750 mg 150 mL/hr over 60 Minutes Intravenous Every 12 hours 10/01/20 0343 10/01/20 1406   10/01/20 1500  cefTRIAXone (ROCEPHIN) 1 g in sodium chloride 0.9 % 100 mL IVPB        1 g 200 mL/hr over 30 Minutes Intravenous Daily 10/01/20 1406     10/01/20 1100  ceFEPIme (MAXIPIME) 2 g in sodium chloride 0.9 % 100 mL IVPB  Status:  Discontinued        2 g 200 mL/hr over 30 Minutes Intravenous Every 8 hours 10/01/20 0343 10/01/20 1406  10/01/20 0230  ceFEPIme (MAXIPIME) 2 g in sodium chloride 0.9 % 100 mL IVPB        2 g 200 mL/hr over 30 Minutes Intravenous  Once 10/01/20 0219 10/01/20 0402   10/01/20 0230  metroNIDAZOLE (FLAGYL) IVPB 500 mg        500 mg 100 mL/hr over 60 Minutes Intravenous  Once 10/01/20 0219 10/01/20 0628   10/01/20 0230  vancomycin (VANCOCIN) IVPB 1000 mg/200 mL premix        1,000 mg 200 mL/hr over 60 Minutes Intravenous  Once 10/01/20 0219 10/01/20 0515        Objective: Vitals:   10/01/20 1955 10/01/20 2128 10/02/20 0229 10/02/20 0609  BP:  127/76 126/78 131/77  Pulse:  99 99 (!) 103  Resp:  18 16 16    Temp: (!) 97.5 F (36.4 C) 99.4 F (37.4 C) 97.8 F (36.6 C) 98.6 F (37 C)  TempSrc: Oral Oral Oral Oral  SpO2:  97% 96% 98%  Weight:      Height:        Intake/Output Summary (Last 24 hours) at 10/02/2020 1243 Last data filed at 10/02/2020 0303 Gross per 24 hour  Intake 103.22 ml  Output --  Net 103.22 ml   Filed Weights   10/01/20 0211 10/01/20 1336  Weight: 95.3 kg 99 kg    Examination: General exam: Appears comfortable  HEENT: PERRLA, oral mucosa moist, no sclera icterus or thrush Respiratory system: Clear to auscultation. Respiratory effort normal. Cardiovascular system: S1 & S2 heard, RRR.   Gastrointestinal system: Abdomen soft, non-tender, nondistended. Normal bowel sounds. Central nervous system: Alert and oriented. No focal neurological deficits. Extremities: No cyanosis, clubbing or edema Skin: No rashes or ulcers Psychiatry:  Mood & affect appropriate.     Data Reviewed: I have personally reviewed following labs and imaging studies  CBC: Recent Labs  Lab 10/01/20 0227 10/02/20 0432  WBC 9.1 6.0  NEUTROABS 7.3  --   HGB 11.7* 10.8*  HCT 35.0* 33.1*  MCV 87.3 89.5  PLT 252 202   Basic Metabolic Panel: Recent Labs  Lab 10/01/20 0227 10/02/20 0432  NA 133* 143  K 3.2* 3.5  CL 98 108  CO2 28 27  GLUCOSE 132* 124*  BUN 18 13  CREATININE 0.72 0.71  CALCIUM 8.6* 8.8*  MG  --  2.2  PHOS  --  3.0   GFR: Estimated Creatinine Clearance: 89.7 mL/min (by C-G formula based on SCr of 0.71 mg/dL). Liver Function Tests: Recent Labs  Lab 10/01/20 0227 10/02/20 0432  AST 24 19  ALT 27 24  ALKPHOS 58 44  BILITOT 0.4 0.5  PROT 7.3 6.2*  ALBUMIN 3.6 3.1*   No results for input(s): LIPASE, AMYLASE in the last 168 hours. No results for input(s): AMMONIA in the last 168 hours. Coagulation Profile: Recent Labs  Lab 10/01/20 0227  INR 1.0   Cardiac Enzymes: No results for input(s): CKTOTAL, CKMB, CKMBINDEX, TROPONINI in the last 168  hours. BNP (last 3 results) No results for input(s): PROBNP in the last 8760 hours. HbA1C: No results for input(s): HGBA1C in the last 72 hours. CBG: No results for input(s): GLUCAP in the last 168 hours. Lipid Profile: No results for input(s): CHOL, HDL, LDLCALC, TRIG, CHOLHDL, LDLDIRECT in the last 72 hours. Thyroid Function Tests: No results for input(s): TSH, T4TOTAL, FREET4, T3FREE, THYROIDAB in the last 72 hours. Anemia Panel: No results for input(s): VITAMINB12, FOLATE, FERRITIN, TIBC, IRON, RETICCTPCT in the last 72  hours. Urine analysis:    Component Value Date/Time   COLORURINE YELLOW 10/01/2020 0227   APPEARANCEUR CLOUDY (A) 10/01/2020 0227   LABSPEC 1.020 10/01/2020 0227   PHURINE 7.0 10/01/2020 0227   GLUCOSEU NEGATIVE 10/01/2020 0227   HGBUR LARGE (A) 10/01/2020 0227   HGBUR negative 08/15/2006 0951   BILIRUBINUR SMALL (A) 10/01/2020 0227   KETONESUR NEGATIVE 10/01/2020 0227   PROTEINUR >300 (A) 10/01/2020 0227   UROBILINOGEN 0.2 04/15/2013 0949   NITRITE POSITIVE (A) 10/01/2020 0227   LEUKOCYTESUR LARGE (A) 10/01/2020 0227   Sepsis Labs: @LABRCNTIP (procalcitonin:4,lacticidven:4) ) Recent Results (from the past 240 hour(s))  Resp Panel by RT-PCR (Flu A&B, Covid) Nasopharyngeal Swab     Status: None   Collection Time: 10/01/20  2:27 AM   Specimen: Nasopharyngeal Swab; Nasopharyngeal(NP) swabs in vial transport medium  Result Value Ref Range Status   SARS Coronavirus 2 by RT PCR NEGATIVE NEGATIVE Final    Comment: (NOTE) SARS-CoV-2 target nucleic acids are NOT DETECTED.  The SARS-CoV-2 RNA is generally detectable in upper respiratory specimens during the acute phase of infection. The lowest concentration of SARS-CoV-2 viral copies this assay can detect is 138 copies/mL. A negative result does not preclude SARS-Cov-2 infection and should not be used as the sole basis for treatment or other patient management decisions. A negative result may occur with   improper specimen collection/handling, submission of specimen other than nasopharyngeal swab, presence of viral mutation(s) within the areas targeted by this assay, and inadequate number of viral copies(<138 copies/mL). A negative result must be combined with clinical observations, patient history, and epidemiological information. The expected result is Negative.  Fact Sheet for Patients:  10/03/20  Fact Sheet for Healthcare Providers:  BloggerCourse.com  This test is no t yet approved or cleared by the [employer] FDA and  has been authorized for detection and/or diagnosis of SARS-CoV-2 by FDA under an Emergency Use Authorization (EUA). This EUA will remain  in effect (meaning this test can be used) for the duration of the COVID-19 declaration under Section 564(b)(1) of the Act, 21 U.S.C.section 360bbb-3(b)(1), unless the authorization is terminated  or revoked sooner.       Influenza A by PCR NEGATIVE NEGATIVE Final   Influenza B by PCR NEGATIVE NEGATIVE Final    Comment: (NOTE) The Xpert Xpress SARS-CoV-2/FLU/RSV plus assay is intended as an aid in the diagnosis of influenza from Nasopharyngeal swab specimens and should not be used as a sole basis for treatment. Nasal washings and aspirates are unacceptable for Xpert Xpress SARS-CoV-2/FLU/RSV testing.  Fact Sheet for Patients: [place]  Fact Sheet for Healthcare Providers: BloggerCourse.com  This test is not yet approved or cleared by the [employer] FDA and has been authorized for detection and/or diagnosis of SARS-CoV-2 by FDA under an Emergency Use Authorization (EUA). This EUA will remain in effect (meaning this test can be used) for the duration of the COVID-19 declaration under Section 564(b)(1) of the Act, 21 U.S.C. section 360bbb-3(b)(1), unless the authorization is terminated  or revoked.  Performed at [facility], [address] [place], [place]   MRSA Next Gen by PCR, Nasal     Status: None   Collection Time: 10/01/20  5:10 AM   Specimen: Nasal Mucosa; Nasal Swab  Result Value Ref Range Status   MRSA by PCR Next Gen NOT DETECTED NOT DETECTED Final    Comment: (NOTE) The GeneXpert MRSA Assay (FDA approved for NASAL specimens only), is one component of a  comprehensive MRSA colonization surveillance program. It is not intended to diagnose MRSA infection nor to guide or monitor treatment for MRSA infections. Test performance is not FDA approved in patients less than 30 years old. Performed at [facility], [address] [place], [place] [address]          Radiology Studies: DG Chest Port 1 View  Result Date: 10/01/2020 CLINICAL DATA:  Questionable sepsis. Flu like symptoms. Vomiting and chills. EXAM: PORTABLE CHEST 1 VIEW COMPARISON:  09/22/2014 FINDINGS: The cardiomediastinal contours are normal. The lungs are clear. Pulmonary vasculature is normal. No consolidation, pleural effusion, or pneumothorax. No acute osseous abnormalities are seen. IMPRESSION: No acute chest findings. Electronically Signed   By: Narda Rutherford M.D.   On: 10/01/2020 03:10   CT Renal Stone Study  Result Date: 10/01/2020 CLINICAL DATA:  Flank pain, kidney stone suspected. Patient reports flu like symptoms. Pain with urination. Vomiting and chills. Groin pain. EXAM: CT ABDOMEN AND PELVIS WITHOUT CONTRAST TECHNIQUE: Multidetector CT imaging of the abdomen and pelvis was performed following the standard protocol without IV contrast. COMPARISON:  CT 07/31/2016 FINDINGS: Lower chest: No pleural effusion or acute airspace disease. Hepatobiliary: No focal hepatic abnormality on this unenhanced exam. Decompressed gallbladder. No calcified gallstone or biliary dilatation. Pancreas: No ductal dilatation or inflammation. Spleen: Normal in size without focal  abnormality. Adrenals/Urinary Tract: Normal adrenal glands. Mild left perinephric edema and prominence of the lower pole calyx and ureter. No renal or ureteric stones are seen. No right hydronephrosis or renal calculi. Right ureter is decompressed. Mild urinary bladder wall thickening. No bladder stone. Stomach/Bowel: Unremarkable stomach. No small bowel obstruction or inflammation. Scattered colonic diverticulosis without diverticulitis. Low lying cecum in the mid pelvis. The appendix is normal, coursing in the midline. Vascular/Lymphatic: Normal caliber abdominal aorta. No portal venous or mesenteric gas. No enlarged lymph nodes in the abdomen or pelvis. Reproductive: Unremarkable CT appearance of the uterus and ovaries. No adnexal mass. Other: Small fat containing umbilical hernia. No ascites or free air. Musculoskeletal: Mild multilevel degenerative disc disease in the lumbar spine. There are no acute or suspicious osseous abnormalities. IMPRESSION: 1. Mild left perinephric edema and prominence of the lower pole calyx and ureter, suspicious for urinary tract infection. No renal or ureteric stones. 2. Mild urinary bladder wall thickening, can be seen with cystitis. 3. Colonic diverticulosis without diverticulitis. 4. Small fat containing umbilical hernia. Electronically Signed   By: Narda Rutherford M.D.   On: 10/01/2020 03:10      Scheduled Meds:  atorvastatin  20 mg Oral Daily   azelastine  1 spray Each Nare BID   enoxaparin (LOVENOX) injection  40 mg Subcutaneous Q24H   fluticasone  1 spray Each Nare Daily   influenza vac split quadrivalent PF  0.5 mL Intramuscular Tomorrow-1000   loratadine  10 mg Oral Daily   pantoprazole  40 mg Oral BID   valACYclovir  500 mg Oral Daily   Continuous Infusions:  sodium chloride 10 mL/hr at 10/01/20 0324   cefTRIAXone (ROCEPHIN)  IV 1 g (10/02/20 0944)     LOS: 1 day      Calvert Cantor, MD [facility] Pager: [email or web address] 10/02/2020, 12:43 PM

## 2020-10-03 DIAGNOSIS — A4151 Sepsis due to Escherichia coli [E. coli]: Principal | ICD-10-CM

## 2020-10-03 LAB — URINE CULTURE
Culture: >=100000 — AB
Special Requests: NORMAL

## 2020-10-03 MED ORDER — CEPHALEXIN 500 MG PO CAPS: 500.0000 mg | ORAL_CAPSULE | Freq: Three times a day (TID) | ORAL | 0 refills | Status: DC

## 2020-10-03 MED ORDER — ONDANSETRON HCL 4 MG PO TABS: 4.0000 mg | ORAL_TABLET | Freq: Four times a day (QID) | ORAL | 0 refills | Status: DC | PRN

## 2020-10-03 MED ORDER — CEPHALEXIN 500 MG PO CAPS: 500.0000 mg | ORAL_CAPSULE | Freq: Two times a day (BID) | ORAL | Status: DC

## 2020-10-03 MED ORDER — CEPHALEXIN 500 MG PO CAPS: 500.0000 mg | ORAL_CAPSULE | Freq: Three times a day (TID) | ORAL | 0 refills | Status: AC

## 2020-10-03 NOTE — Plan of Care (Signed)
  Problem: Clinical Measurements: Goal: Ability to maintain clinical measurements within normal limits will improve Outcome: Progressing   Problem: Coping: Goal: Level of anxiety will decrease Outcome: Progressing   Problem: Education: Goal: Knowledge of General Education information will improve Description: Including pain rating scale, medication(s)/side effects and non-pharmacologic comfort measures Outcome: Adequate for Discharge   Problem: Health Behavior/Discharge Planning: Goal: Ability to manage health-related needs will improve Outcome: Adequate for Discharge   Problem: Activity: Goal: Risk for activity intolerance will decrease Outcome: Adequate for Discharge   Problem: Nutrition: Goal: Adequate nutrition will be maintained Outcome: Adequate for Discharge

## 2020-10-03 NOTE — Discharge Summary (Signed)
Physician Discharge Summary  GENISIS SONNIER KGM:[id number] DOB: [birth year]  PCP: Charisse March, NP-C  Admitted from: Home Discharged to: Home  Admit date: 10/01/2020 Discharge date: 10/03/2020  Recommendations for Outpatient Follow-up:    Follow-up Information     Charisse March, NP-C. Schedule an appointment as soon as possible for a visit in 1 week(s).   Specialty: Family Medicine Why: To be seen with repeat labs (CBC & BMP). Contact information: [address] [facility] [address] [place] [address] [phone]                  Home Health: None    Equipment/Devices: None    Discharge Condition: Improved and stable.   Code Status: Full Code Diet recommendation:  Discharge Diet Orders (From admission, onward)     Start     Ordered   10/03/20 0000  Diet - low sodium heart healthy        10/03/20 1345             Discharge Diagnoses:  Principal Problem:   UTI (urinary tract infection) Active Problems:   Essential hypertension   GERD   Hypertension   Hyperlipidemia   Acute pyelonephritis   Sepsis (HCC)   Hypokalemia   Brief Summary: Jessica Glass is a 55 y.o. female, works at [employer], with PMH of hypertension, dyslipidemia, GERD, OSA, seasonal allergies who presented to the ER on 09/30/2020 with dysuria, pelvic pain, nausea and vomiting and was noticed with pyelonephritis.  She states she has never had a urinary traction infection in the past.  In the ED: CT renal stone study revealed mild left perinephric edema and prominence of the lower pole calyx and ureter, mild urinary bladder wall thickening, colonic diverticulosis and small fat-containing umbilical hernia. Temperature was 101.5, heart rate was greater than 100, respiratory rate was greater than 25 WBC count was normal. UA was consistent with a UTI   She was initially treated with vancomycin, cefepime and Flagyl.  She was subsequently narrowed down to ceftriaxone    Assessment & Plan:    Principal Problem: E. coli acute pyelonephritis, acute cystitis with sepsis present on admission -Suprapubic pain and dysuria have resolved -Completed 3 days of IV ceftriaxone today. - Blood cultures negative to date - Urine culture shows >100 K colonies per mL of pansensitive E. coli.  After today's dose of ceftriaxone, transitioned to Keflex at discharge to complete total 7 days course. - Discussed extensively with patient and sister regarding importance of local hygiene to prevent recurrent UTIs and adequate hydration.  If she does develop recurrent UTIs, will need outpatient evaluation which may include urology consultation. - Sepsis resolved.   Active Problems: Nausea and vomiting - Likely secondary to sepsis from E. coli pyelonephritis/acute cystitis. - Discontinued NSAIDs and should avoid. - Continue prior home dose of twice daily PPI. - Has not vomited for almost 48 hours and reports tolerating diet well.  As needed Zofran at discharge.   Hypokalemia -Secondary to poor oral intake, vomiting and HCTZ use - Has been replaced.  Magnesium 2.2. - Periodically follow BMP as outpatient.     Essential hypertension -Resume prior home dose of HCTZ-benazepril     GERD -Continue PPI twice daily.     Hyperlipidemia -Continue a atorvastatin  Anemia - Suspect dilutional.  No bleeding reported - Follow CBC as outpatient.   Morbid obesity - Body mass index is 37.46 kg/m.       Consultations: None  Procedures: None  Discharge Instructions  Discharge Instructions     Call MD for:  difficulty breathing, headache or visual disturbances   Complete by: As directed    Call MD for:  extreme fatigue   Complete by: As directed    Call MD for:  persistant dizziness or light-headedness   Complete by: As directed    Call MD for:  persistant nausea and vomiting   Complete by: As directed    Call MD for:  severe uncontrolled pain   Complete by: As directed    Call MD for:   temperature >100.4   Complete by: As directed    Diet - low sodium heart healthy   Complete by: As directed    Increase activity slowly   Complete by: As directed         Medication List     TAKE these medications    atorvastatin 20 MG tablet Commonly known as: LIPITOR Take 20 mg by mouth daily.   Azelastine HCl 137 MCG/SPRAY Soln Place 1 spray into both nostrils in the morning and at bedtime.   benazepril-hydrochlorthiazide 20-12.5 MG tablet Commonly known as: LOTENSIN HCT Take 1 tablet by mouth daily.   cephALEXin 500 MG capsule Commonly known as: KEFLEX Take 1 capsule (500 mg total) by mouth 3 (three) times daily for 4 days. Start taking on: October 04, 2020   cetirizine 10 MG tablet Commonly known as: ZYRTEC Take 10 mg by mouth daily.   cholecalciferol 25 MCG (1000 UNIT) tablet Commonly known as: VITAMIN D3 Take 2,000 Units by mouth daily.   fluticasone 50 MCG/ACT nasal spray Commonly known as: FLONASE Place 1 spray into both nostrils in the morning and at bedtime.   gabapentin 300 MG capsule Commonly known as: NEURONTIN Take 300 mg by mouth 2 (two) times daily as needed (pain).   Ginkgo Biloba Extract 60 MG Caps Take 1 capsule by mouth daily.   ondansetron 4 MG tablet Commonly known as: ZOFRAN Take 1 tablet (4 mg total) by mouth every 6 (six) hours as needed for nausea or vomiting.   pantoprazole 40 MG tablet Commonly known as: PROTONIX Take 40 mg by mouth 2 (two) times daily.   valACYclovir 500 MG tablet Commonly known as: VALTREX Take 500 mg by mouth daily.       No Known Allergies    Procedures/Studies: DG Chest Port 1 View  Result Date: 10/01/2020 CLINICAL DATA:  Questionable sepsis. Flu like symptoms. Vomiting and chills. EXAM: PORTABLE CHEST 1 VIEW COMPARISON:  09/22/2014 FINDINGS: The cardiomediastinal contours are normal. The lungs are clear. Pulmonary vasculature is normal. No consolidation, pleural effusion, or pneumothorax. No  acute osseous abnormalities are seen. IMPRESSION: No acute chest findings. Electronically Signed   By: Narda Rutherford M.D.   On: 10/01/2020 03:10   CT Renal Stone Study  Result Date: 10/01/2020 CLINICAL DATA:  Flank pain, kidney stone suspected. Patient reports flu like symptoms. Pain with urination. Vomiting and chills. Groin pain. EXAM: CT ABDOMEN AND PELVIS WITHOUT CONTRAST TECHNIQUE: Multidetector CT imaging of the abdomen and pelvis was performed following the standard protocol without IV contrast. COMPARISON:  CT 07/31/2016 FINDINGS: Lower chest: No pleural effusion or acute airspace disease. Hepatobiliary: No focal hepatic abnormality on this unenhanced exam. Decompressed gallbladder. No calcified gallstone or biliary dilatation. Pancreas: No ductal dilatation or inflammation. Spleen: Normal in size without focal abnormality. Adrenals/Urinary Tract: Normal adrenal glands. Mild left perinephric edema and prominence of the lower pole calyx and ureter. No renal or ureteric  stones are seen. No right hydronephrosis or renal calculi. Right ureter is decompressed. Mild urinary bladder wall thickening. No bladder stone. Stomach/Bowel: Unremarkable stomach. No small bowel obstruction or inflammation. Scattered colonic diverticulosis without diverticulitis. Low lying cecum in the mid pelvis. The appendix is normal, coursing in the midline. Vascular/Lymphatic: Normal caliber abdominal aorta. No portal venous or mesenteric gas. No enlarged lymph nodes in the abdomen or pelvis. Reproductive: Unremarkable CT appearance of the uterus and ovaries. No adnexal mass. Other: Small fat containing umbilical hernia. No ascites or free air. Musculoskeletal: Mild multilevel degenerative disc disease in the lumbar spine. There are no acute or suspicious osseous abnormalities. IMPRESSION: 1. Mild left perinephric edema and prominence of the lower pole calyx and ureter, suspicious for urinary tract infection. No renal or ureteric  stones. 2. Mild urinary bladder wall thickening, can be seen with cystitis. 3. Colonic diverticulosis without diverticulitis. 4. Small fat containing umbilical hernia. Electronically Signed   By: Narda Rutherford M.D.   On: 10/01/2020 03:10      Subjective: Interviewed and examined patient while patient's sister was on patient's speaker phone.  Patient reports feeling much better.  Some intermittent nausea but no vomiting since Saturday.  Tolerating diet well.  Ambulating in the room steadily.  Dysuria resolved today.  No pain reported.  Has not had UTIs in the past.  Discharge Exam:  Vitals:   10/02/20 1318 10/02/20 1948 10/03/20 0507 10/03/20 1227  BP: 124/79 126/81 137/73 117/74  Pulse: 80 85 77 87  Resp: 18 16 18 16   Temp: 97.7 F (36.5 C) 98.4 F (36.9 C) 97.8 F (36.6 C) 98 F (36.7 C)  TempSrc: Oral Oral Oral Oral  SpO2: 98% 98% 96% 97%  Weight:      Height:        General: Pt lying comfortably in bed & appears in no obvious distress.  Pleasant young female, moderately built and obese sitting up comfortably in reclining chair. Cardiovascular: S1 & S2 heard, RRR, S1/S2 +. No murmurs, rubs, gallops or clicks. No JVD or pedal edema.  Telemetry personally reviewed: Sinus rhythm. Respiratory: Clear to auscultation without wheezing, rhonchi or crackles. No increased work of breathing. Abdominal:  Non distended, non tender & soft. No organomegaly or masses appreciated. Normal bowel sounds heard. CNS: Alert and oriented. No focal deficits. Extremities: no edema, no cyanosis    The results of significant diagnostics from this hospitalization (including imaging, microbiology, ancillary and laboratory) are listed below for reference.     Microbiology: Recent Results (from the past 240 hour(s))  Resp Panel by RT-PCR (Flu A&B, Covid) Nasopharyngeal Swab     Status: None   Collection Time: 10/01/20  2:27 AM   Specimen: Nasopharyngeal Swab; Nasopharyngeal(NP) swabs in vial transport  medium  Result Value Ref Range Status   SARS Coronavirus 2 by RT PCR NEGATIVE NEGATIVE Final    Comment: (NOTE) SARS-CoV-2 target nucleic acids are NOT DETECTED.  The SARS-CoV-2 RNA is generally detectable in upper respiratory specimens during the acute phase of infection. The lowest concentration of SARS-CoV-2 viral copies this assay can detect is 138 copies/mL. A negative result does not preclude SARS-Cov-2 infection and should not be used as the sole basis for treatment or other patient management decisions. A negative result may occur with  improper specimen collection/handling, submission of specimen other than nasopharyngeal swab, presence of viral mutation(s) within the areas targeted by this assay, and inadequate number of viral copies(<138 copies/mL). A negative result must be combined with  clinical observations, patient history, and epidemiological information. The expected result is Negative.  Fact Sheet for Patients:  BloggerCourse.com  Fact Sheet for Healthcare Providers:  SeriousBroker.it  This test is no t yet approved or cleared by the [place] FDA and  has been authorized for detection and/or diagnosis of SARS-CoV-2 by FDA under an Emergency Use Authorization (EUA). This EUA will remain  in effect (meaning this test can be used) for the duration of the COVID-19 declaration under Section 564(b)(1) of the Act, 21 U.S.C.section 360bbb-3(b)(1), unless the authorization is terminated  or revoked sooner.       Influenza A by PCR NEGATIVE NEGATIVE Final   Influenza B by PCR NEGATIVE NEGATIVE Final    Comment: (NOTE) The Xpert Xpress SARS-CoV-2/FLU/RSV plus assay is intended as an aid in the diagnosis of influenza from Nasopharyngeal swab specimens and should not be used as a sole basis for treatment. Nasal washings and aspirates are unacceptable for Xpert Xpress SARS-CoV-2/FLU/RSV testing.  Fact Sheet for  Patients: BloggerCourse.com  Fact Sheet for Healthcare Providers: SeriousBroker.it  This test is not yet approved or cleared by the [place] FDA and has been authorized for detection and/or diagnosis of SARS-CoV-2 by FDA under an Emergency Use Authorization (EUA). This EUA will remain in effect (meaning this test can be used) for the duration of the COVID-19 declaration under Section 564(b)(1) of the Act, 21 U.S.C. section 360bbb-3(b)(1), unless the authorization is terminated or revoked.  Performed at [facility], [address] [place], [place] [address]   Urine Culture     Status: Abnormal   Collection Time: 10/01/20  2:27 AM   Specimen: In/Out Cath Urine  Result Value Ref Range Status   Specimen Description   Final    IN/OUT CATH URINE Performed at [facility], [address] [place], [place] [address]    Special Requests   Final    Normal Performed at [facility], [address] [place], [place] [address]    Culture >=100,000 COLONIES/mL ESCHERICHIA COLI (A)  Final   Report Status 10/03/2020 FINAL  Final   Organism ID, Bacteria ESCHERICHIA COLI (A)  Final      Susceptibility   Escherichia coli - MIC*    AMPICILLIN 4 SENSITIVE Sensitive     CEFAZOLIN <=4 SENSITIVE Sensitive     CEFEPIME <=0.12 SENSITIVE Sensitive     CEFTRIAXONE <=0.25 SENSITIVE Sensitive     CIPROFLOXACIN <=0.25 SENSITIVE Sensitive     GENTAMICIN <=1 SENSITIVE Sensitive     IMIPENEM <=0.25 SENSITIVE Sensitive     NITROFURANTOIN <=16 SENSITIVE Sensitive     TRIMETH/SULFA <=20 SENSITIVE Sensitive     AMPICILLIN/SULBACTAM <=2 SENSITIVE Sensitive     PIP/TAZO <=4 SENSITIVE Sensitive     * >=100,000 COLONIES/mL ESCHERICHIA COLI  Blood culture (routine x 2)     Status: None (Preliminary result)   Collection Time: 10/01/20  2:45 AM   Specimen: Right Antecubital; Blood  Result Value Ref Range Status   Specimen  Description   Final    RIGHT ANTECUBITAL Performed at [facility], [address] [place], [place] [address]    Special Requests   Final    BOTTLES DRAWN AEROBIC AND ANAEROBIC Blood Culture adequate volume Performed at [facility], [address] [place], [place] [address]    Culture   Final    NO GROWTH 2 DAYS Performed at [facility],  [address] [place], [place] [address]    Report Status PENDING  Incomplete  Blood culture (routine x 2)     Status: None (Preliminary result)   Collection Time: 10/01/20  3:28 AM   Specimen: Left Antecubital; Blood  Result Value Ref Range Status   Specimen Description   Final    LEFT ANTECUBITAL Performed at [facility], [address] [place], [place] [address]    Special Requests   Final    BOTTLES DRAWN AEROBIC AND ANAEROBIC Blood Culture adequate volume Performed at [facility], [address] [place], [place] [address]    Culture   Final    NO GROWTH 2 DAYS Performed at [facility], [address] [place], [place] [address]    Report Status PENDING  Incomplete  MRSA Next Gen by PCR, Nasal     Status: None   Collection Time: 10/01/20  5:10 AM   Specimen: Nasal Mucosa; Nasal Swab  Result Value Ref Range Status   MRSA by PCR Next Gen NOT DETECTED NOT DETECTED Final    Comment: (NOTE) The GeneXpert MRSA Assay (FDA approved for NASAL specimens only), is one component of a comprehensive MRSA colonization surveillance program. It is not intended to diagnose MRSA infection nor to guide or monitor treatment for MRSA infections. Test performance is not FDA approved in patients less than 18 years old. Performed at [facility], [address] [place], [place] [address]      Labs: CBC: Recent Labs  Lab 10/01/20 0227 10/02/20 0432  WBC 9.1 6.0  NEUTROABS 7.3  --   HGB 11.7* 10.8*  HCT 35.0* 33.1*  MCV 87.3 89.5  PLT 252 202    Basic Metabolic  Panel: Recent Labs  Lab 10/01/20 0227 10/02/20 0432  NA 133* 143  K 3.2* 3.5  CL 98 108  CO2 28 27  GLUCOSE 132* 124*  BUN 18 13  CREATININE 0.72 0.71  CALCIUM 8.6* 8.8*  MG  --  2.2  PHOS  --  3.0    Liver Function Tests: Recent Labs  Lab 10/01/20 0227 10/02/20 0432  AST 24 19  ALT 27 24  ALKPHOS 58 44  BILITOT 0.4 0.5  PROT 7.3 6.2*  ALBUMIN 3.6 3.1*     Urinalysis    Component Value Date/Time   COLORURINE YELLOW 10/01/2020 0227   APPEARANCEUR CLOUDY (A) 10/01/2020 0227   LABSPEC 1.020 10/01/2020 0227   PHURINE 7.0 10/01/2020 0227   GLUCOSEU NEGATIVE 10/01/2020 0227   HGBUR LARGE (A) 10/01/2020 0227   HGBUR negative 08/15/2006 0951   BILIRUBINUR SMALL (A) 10/01/2020 0227   KETONESUR NEGATIVE 10/01/2020 0227   PROTEINUR >300 (A) 10/01/2020 0227   UROBILINOGEN 0.2 04/15/2013 0949   NITRITE POSITIVE (A) 10/01/2020 0227   LEUKOCYTESUR LARGE (A) 10/01/2020 0227    I discussed in detail with patient's sister via patient's speaker phone, updated care and answered all questions.  Time coordinating discharge: 25 minutes  SIGNED:  Marcellus Scott, MD, FACP, [facility]. [facility]  To contact the attending provider between 7A-7P or the covering provider during after hours 7P-7A, please log into the web site [email or web address] and access using universal [place] password for that web site. If you do not have the password, please call the hospital operator.

## 2020-10-03 NOTE — Progress Notes (Signed)
AVS and discharge instructions reviewed with pt. Pt. Verbalized understanding and had no further questions. Patient picked up by family member.

## 2020-10-03 NOTE — Discharge Instructions (Signed)

## 2020-10-06 LAB — CULTURE, BLOOD (ROUTINE X 2)
Culture: NO GROWTH
Culture: NO GROWTH
Special Requests: ADEQUATE
Special Requests: ADEQUATE

## 2020-11-22 ENCOUNTER — Other Ambulatory Visit: Payer: Self-pay | Admitting: Obstetrics and Gynecology

## 2020-11-22 DIAGNOSIS — Z1231 Encounter for screening mammogram for malignant neoplasm of breast: Secondary | ICD-10-CM

## 2021-01-25 ENCOUNTER — Other Ambulatory Visit: Payer: Self-pay

## 2021-01-25 ENCOUNTER — Ambulatory Visit (INDEPENDENT_AMBULATORY_CARE_PROVIDER_SITE_OTHER): Payer: Managed Care, Other (non HMO)

## 2021-01-25 DIAGNOSIS — Z1231 Encounter for screening mammogram for malignant neoplasm of breast: Secondary | ICD-10-CM | POA: Diagnosis not present

## 2021-02-14 ENCOUNTER — Other Ambulatory Visit: Payer: Self-pay

## 2021-02-14 ENCOUNTER — Emergency Department (INDEPENDENT_AMBULATORY_CARE_PROVIDER_SITE_OTHER)
Admission: EM | Admit: 2021-02-14 | Discharge: 2021-02-14 | Disposition: A | Discharge status: Home / Self Care | Payer: Managed Care, Other (non HMO) | Source: Home / Self Care

## 2021-02-14 ENCOUNTER — Encounter: Payer: Self-pay | Admitting: Emergency Medicine

## 2021-02-14 DIAGNOSIS — H5789 Other specified disorders of eye and adnexa: Secondary | ICD-10-CM

## 2021-02-14 DIAGNOSIS — H5711 Ocular pain, right eye: Secondary | ICD-10-CM

## 2021-02-14 DIAGNOSIS — I10 Essential (primary) hypertension: Secondary | ICD-10-CM

## 2021-02-14 MED ORDER — BENAZEPRIL-HYDROCHLOROTHIAZIDE 20-12.5 MG PO TABS: 1.0000 | ORAL_TABLET | Freq: Every day | ORAL | 0 refills | Status: DC

## 2021-02-14 MED ORDER — KETOROLAC TROMETHAMINE 0.5 % OP SOLN: 1.0000 [drp] | Freq: Four times a day (QID) | OPHTHALMIC | 0 refills | Status: AC

## 2021-02-14 MED ORDER — MOXIFLOXACIN HCL 0.5 % OP SOLN: 1.0000 [drp] | Freq: Three times a day (TID) | OPHTHALMIC | 0 refills | Status: AC

## 2021-02-14 NOTE — ED Provider Notes (Signed)
[facility]    CSN: [id number] Arrival date & time: 02/14/21  1803      History   Chief Complaint Chief Complaint  Patient presents with   Eye Pain    right    HPI Jessica Glass is a 56 y.o. female.   HPI 56 year old female presents with right eye redness for 5 days.  PMH significant for OSA and HTN.  Additionally, patient request 2 weeks worth of blood pressure medication as as hers has not arrived yet from mail-order pharmacy.  Past Medical History:  Diagnosis Date   Arrhythmia 05/26/09   ECHO ALL CHAMBERS  ARE NORMAL IN SIZE AND FUNCTION EF70%   Cervical spondylosis 12/20/2017   GERD (gastroesophageal reflux disease)    Hyperlipidemia    Hypertension    Obstructive sleep apnea    currently wearing CPAP   Seasonal allergies     Patient Active Problem List   Diagnosis Date Noted   Acute pyelonephritis 10/01/2020   Sepsis (HCC) 10/01/2020   UTI (urinary tract infection) 10/01/2020   Hypokalemia 10/01/2020   Chest pain 01/02/2018   Obstructive sleep apnea 04/10/2013   Hypertension    Hyperlipidemia    BACK PAIN 02/28/2007   GERD 08/21/2006   Essential hypertension 08/15/2006   ALLERGIC RHINITIS 08/15/2006    Past Surgical History:  Procedure Laterality Date   ADENOIDECTOMY     BLADDER SUSPENSION     DEBRIDEMENT TENNIS ELBOW     ELBOW SURGERY     bilateral tendon surgery   PATELLA RECONSTRUCTION Left    TONSILLECTOMY      OB History   No obstetric history on file.      Home Medications    Prior to Admission medications   Medication Sig Start Date End Date Taking? Authorizing Provider  ketorolac (ACULAR) 0.5 % ophthalmic solution Place 1 drop into the right eye 4 (four) times daily for 5 days. 02/14/21 02/19/21 Yes Trevor Iha, FNP  moxifloxacin (VIGAMOX) 0.5 % ophthalmic solution Place 1 drop into the right eye 3 (three) times daily for 5 days. 02/14/21 02/19/21 Yes Trevor Iha, FNP  atorvastatin (LIPITOR) 20 MG tablet Take 20 mg by  mouth daily.    [provider]  Azelastine HCl 137 MCG/SPRAY SOLN Place 1 spray into both nostrils in the morning and at bedtime. 07/13/20   [provider]  benazepril-hydrochlorthiazide (LOTENSIN HCT) 20-12.5 MG tablet Take 1 tablet by mouth daily. 02/14/21 03/16/21  Trevor Iha, FNP  cetirizine (ZYRTEC) 10 MG tablet Take 10 mg by mouth daily.    [provider]  cholecalciferol (VITAMIN D3) 25 MCG (1000 UNIT) tablet Take 2,000 Units by mouth daily.    [provider]  fluticasone (FLONASE) 50 MCG/ACT nasal spray Place 1 spray into both nostrils in the morning and at bedtime. 07/13/20   [provider]  gabapentin (NEURONTIN) 300 MG capsule Take 300 mg by mouth 2 (two) times daily as needed (pain). 09/15/20   [provider]  Ginkgo Biloba Extract 60 MG CAPS Take 1 capsule by mouth daily.    [provider]  ondansetron (ZOFRAN) 4 MG tablet Take 1 tablet (4 mg total) by mouth every 6 (six) hours as needed for nausea or vomiting. 10/03/20   Hongalgi, Maximino Greenland, MD  pantoprazole (PROTONIX) 40 MG tablet Take 40 mg by mouth 2 (two) times daily.     [provider]  valACYclovir (VALTREX) 500 MG tablet Take 500 mg by mouth daily.  [provider]    Family History Family History  Problem Relation Age of Onset   Cancer Mother        ovarian   Hypertension Mother    Diabetes Mother    Heart disease Mother    Hyperlipidemia Father    Hypertension Father    Cancer Maternal Aunt        breast   Breast cancer Paternal Aunt 53    Social History Social History   Tobacco Use   Smoking status: Former    Packs/day: 0.25    Types: Cigarettes    Quit date: 06/06/2007    Years since quitting: 13.7    Passive exposure: Never   Smokeless tobacco: Never  Vaping Use   Vaping Use: Never used  Substance Use Topics   Alcohol use: No   Drug use: No     Allergies   Patient has no known allergies.   Review of  Systems Review of Systems  Eyes:  Positive for pain and redness.  All other systems reviewed and are negative.   Physical Exam Triage Vital Signs ED Triage Vitals  Enc Vitals Group     BP      Pulse      Resp      Temp      Temp src      SpO2      Weight      Height      Head Circumference      Peak Flow      Pain Score      Pain Loc      Pain Edu?      Excl. in GC?    No data found.  Updated Vital Signs BP 138/84 (BP Location: Left Arm)    Pulse 86    Temp 98.3 F (36.8 C) (Oral)    Resp 16    Ht 5\' 4"  (1.626 m)    Wt 220 lb (99.8 kg)    LMP 09/09/2015 Comment: Pt denies pregnancy-"menopausal symptoms" per pt   SpO2 99%    BMI 37.76 kg/m    Physical Exam Vitals and nursing note reviewed.  Constitutional:      General: She is not in acute distress.    Appearance: Normal appearance. She is obese.  HENT:     Head: Normocephalic and atraumatic.     Mouth/Throat:     Mouth: Mucous membranes are moist.     Pharynx: Oropharynx is clear.  Eyes:     Extraocular Movements: Extraocular movements intact.     Conjunctiva/sclera: Conjunctivae normal.     Pupils: Pupils are equal, round, and reactive to light.     Comments: Right eye: sclera with +3 injection  Cardiovascular:     Rate and Rhythm: Normal rate and regular rhythm.     Pulses: Normal pulses.     Heart sounds: Normal heart sounds. No murmur heard.   No friction rub. No gallop.  Pulmonary:     Effort: Pulmonary effort is normal.     Breath sounds: Normal breath sounds.  Musculoskeletal:     Cervical back: Normal range of motion and neck supple.  Skin:    General: Skin is warm and dry.  Neurological:     General: No focal deficit present.     Mental Status: She is alert and oriented to person, place, and time.     UC Treatments / Results  Labs (all labs ordered are listed, but only abnormal  results are displayed) Labs Reviewed - No data to display  EKG   Radiology No results  found.  Procedures Procedures (including critical care time)  Medications Ordered in UC Medications - No data to display  Initial Impression / Assessment and Plan / UC Course  I have reviewed the triage vital signs and the nursing notes.  Pertinent labs & imaging results that were available during my care of the patient were reviewed by me and considered in my medical decision making (see chart for details).     MDM: 1.  Right eye redness-Rx'd Vigamox 0.5% ophthalmic solution; 2. Right eye pain-Rx'd Acular 0.5% ophthalmic solution 3.  Hypertension-Rx'd benazepril-hydrochlorothiazide 20-12.5 mg tablet. Advised patient to instill eyedrops as directed.  Advised Vigamox is for right eye redness and Acular is for right eye pain.  We have refilled Lotensin HTC (20-12.5 tab PO once daily )as well for 1 month for patient.  Advised patient if her right eye redness and pain worsens and/or unresolved please follow-up with your ophthalmologist for further evaluation.  Patient discharged home, hemodynamically stable. Final Clinical Impressions(s) / UC Diagnoses   Final diagnoses:  Redness of right eye  Ocular pain, right eye  Essential hypertension     Discharge Instructions      Advised patient to instill eyedrops as directed.  Advised Vigamox is for right eye redness and Acular is for right eye pain.  We have refilled Lotensin HTC (20-12.5 tab PO once daily )as well for 1 month for patient.  Advised patient if her right eye redness and pain worsens and/or unresolved please follow-up with your ophthalmologist for further evaluation.     ED Prescriptions     Medication Sig Dispense Auth. Provider   moxifloxacin (VIGAMOX) 0.5 % ophthalmic solution Place 1 drop into the right eye 3 (three) times daily for 5 days. 3 mL Trevor Iha, FNP   ketorolac (ACULAR) 0.5 % ophthalmic solution Place 1 drop into the right eye 4 (four) times daily for 5 days. 5 mL Trevor Iha, FNP    benazepril-hydrochlorthiazide (LOTENSIN HCT) 20-12.5 MG tablet Take 1 tablet by mouth daily. 30 tablet Trevor Iha, FNP      PDMP not reviewed this encounter.   Trevor Iha, FNP 02/14/21 1844

## 2021-02-14 NOTE — Discharge Instructions (Addendum)
Advised patient to instill eyedrops as directed.  Advised Vigamox is for right eye redness and Acular is for right eye pain.  We have refilled Lotensin HTC (20-12.5 tab PO once daily )as well for 1 month for patient.  Advised patient if her right eye redness and pain worsens and/or unresolved please follow-up with your ophthalmologist for further evaluation.

## 2021-02-14 NOTE — ED Triage Notes (Signed)
Redness to right eye x 5 days  No BP meds x 1 week Intermittent pain to R  eye - no vision changes  Does not wear contact lenses

## 2021-06-18 ENCOUNTER — Other Ambulatory Visit: Payer: Self-pay

## 2021-06-18 ENCOUNTER — Encounter (HOSPITAL_BASED_OUTPATIENT_CLINIC_OR_DEPARTMENT_OTHER): Payer: Self-pay | Admitting: Emergency Medicine

## 2021-06-18 ENCOUNTER — Emergency Department (HOSPITAL_BASED_OUTPATIENT_CLINIC_OR_DEPARTMENT_OTHER)
Admission: EM | Admit: 2021-06-18 | Discharge: 2021-06-18 | Disposition: A | Discharge status: Home / Self Care | Payer: Managed Care, Other (non HMO) | Attending: Emergency Medicine | Admitting: Emergency Medicine

## 2021-06-18 DIAGNOSIS — D72829 Elevated white blood cell count, unspecified: Secondary | ICD-10-CM | POA: Diagnosis not present

## 2021-06-18 DIAGNOSIS — Z79899 Other long term (current) drug therapy: Secondary | ICD-10-CM | POA: Diagnosis not present

## 2021-06-18 DIAGNOSIS — R1032 Left lower quadrant pain: Secondary | ICD-10-CM | POA: Diagnosis present

## 2021-06-18 DIAGNOSIS — N3000 Acute cystitis without hematuria: Secondary | ICD-10-CM | POA: Diagnosis not present

## 2021-06-18 DIAGNOSIS — I10 Essential (primary) hypertension: Secondary | ICD-10-CM | POA: Insufficient documentation

## 2021-06-18 LAB — URINALYSIS, ROUTINE W REFLEX MICROSCOPIC
Bilirubin Urine: NEGATIVE
Glucose, UA: NEGATIVE mg/dL
Hgb urine dipstick: NEGATIVE
Ketones, ur: NEGATIVE mg/dL
Nitrite: NEGATIVE
Protein, ur: NEGATIVE mg/dL
Specific Gravity, Urine: 1.02 (ref 1.005–1.030)
pH: 6 (ref 5.0–8.0)

## 2021-06-18 LAB — URINALYSIS, MICROSCOPIC (REFLEX)

## 2021-06-18 MED ORDER — NITROFURANTOIN MONOHYD MACRO 100 MG PO CAPS: 100.0000 mg | ORAL_CAPSULE | Freq: Two times a day (BID) | ORAL | 0 refills | Status: AC

## 2021-06-18 MED ORDER — ONDANSETRON 8 MG PO TBDP: 8.0000 mg | ORAL_TABLET | Freq: Three times a day (TID) | ORAL | 0 refills | Status: AC | PRN

## 2021-06-18 NOTE — ED Triage Notes (Signed)
Reports urinary frequency, urgency, and burning for about a week.  Also having left lower quad pain that just started.

## 2021-06-18 NOTE — ED Provider Notes (Signed)
[facility] EMERGENCY DEPARTMENT Provider Note   CSN: [id number] Arrival date & time: 06/18/21  1905     History PMH: HTN, HLD, OSA,  Chief Complaint  Patient presents with   Urinary Tract Infection    Jessica Glass is a 56 y.o. female. Presents to the emergency department the chief complaint of dysuria.  She says she has had burning when she urinates for 1 week now.  She also feels like she had increased urination.  She says today she noticed some mild left lower quadrant pain.  She denies any flank pain, hematuria, fevers, chills, diarrhea, constipation, nausea, or vomiting.   Urinary Tract Infection      Home Medications Prior to Admission medications   Medication Sig Start Date End Date Taking? Authorizing Provider  nitrofurantoin, macrocrystal-monohydrate, (MACROBID) 100 MG capsule Take 1 capsule (100 mg total) by mouth 2 (two) times daily for 5 days. 06/18/21 06/23/21 Yes Lisandro Meggett, Finis Bud, PA-C  atorvastatin (LIPITOR) 20 MG tablet Take 20 mg by mouth daily.    [provider]  Azelastine HCl 137 MCG/SPRAY SOLN Place 1 spray into both nostrils in the morning and at bedtime. 07/13/20   [provider]  benazepril-hydrochlorthiazide (LOTENSIN HCT) 20-12.5 MG tablet Take 1 tablet by mouth daily. 02/14/21 03/16/21  Trevor Iha, FNP  cetirizine (ZYRTEC) 10 MG tablet Take 10 mg by mouth daily.    [provider]  cholecalciferol (VITAMIN D3) 25 MCG (1000 UNIT) tablet Take 2,000 Units by mouth daily.    [provider]  fluticasone (FLONASE) 50 MCG/ACT nasal spray Place 1 spray into both nostrils in the morning and at bedtime. 07/13/20   [provider]  gabapentin (NEURONTIN) 300 MG capsule Take 300 mg by mouth 2 (two) times daily as needed (pain). 09/15/20   [provider]  Ginkgo Biloba Extract 60 MG CAPS Take 1 capsule by mouth daily.    [provider]  ondansetron (ZOFRAN) 4 MG tablet Take 1 tablet (4 mg total)  by mouth every 6 (six) hours as needed for nausea or vomiting. 10/03/20   Hongalgi, Maximino Greenland, MD  pantoprazole (PROTONIX) 40 MG tablet Take 40 mg by mouth 2 (two) times daily.     [provider]  valACYclovir (VALTREX) 500 MG tablet Take 500 mg by mouth daily.    [provider]      Allergies    Patient has no known allergies.    Review of Systems   Review of Systems  Genitourinary:  Positive for dysuria.  All other systems reviewed and are negative.   Physical Exam Updated Vital Signs BP (!) 142/79 (BP Location: Right Arm)   Pulse 97   Temp 98.2 F (36.8 C) (Oral)   Resp 20   Ht 5\' 4"  (1.626 m)   Wt 97.5 kg   LMP 09/09/2015 Comment: Pt denies pregnancy-"menopausal symptoms" per pt  SpO2 100%   BMI 36.90 kg/m  Physical Exam Vitals and nursing note reviewed.  Constitutional:      General: She is not in acute distress.    Appearance: Normal appearance. She is well-developed. She is not ill-appearing, toxic-appearing or diaphoretic.  HENT:     Head: Normocephalic and atraumatic.     Nose: No nasal deformity.     Mouth/Throat:     Lips: Pink. No lesions.  Eyes:     General: Gaze aligned appropriately. No scleral icterus.       Right eye: No discharge.  Left eye: No discharge.     Conjunctiva/sclera: Conjunctivae normal.     Right eye: Right conjunctiva is not injected. No exudate or hemorrhage.    Left eye: Left conjunctiva is not injected. No exudate or hemorrhage. Pulmonary:     Effort: Pulmonary effort is normal. No respiratory distress.  Abdominal:     General: Abdomen is flat. There is no distension.     Palpations: Abdomen is soft.     Tenderness: There is no abdominal tenderness. There is no right CVA tenderness, left CVA tenderness, guarding or rebound.     Comments: There is no left lower quadrant tenderness on exam.  No reproducible CVA tenderness.  No suprapubic tenderness.  Skin:    General: Skin is warm and dry.  Neurological:      Mental Status: She is alert and oriented to person, place, and time.  Psychiatric:        Mood and Affect: Mood normal.        Speech: Speech normal.        Behavior: Behavior normal. Behavior is cooperative.     ED Results / Procedures / Treatments   Labs (all labs ordered are listed, but only abnormal results are displayed) Labs Reviewed  URINALYSIS, ROUTINE W REFLEX MICROSCOPIC - Abnormal; Notable for the following components:      Result Value   Leukocytes,Ua SMALL (*)    All other components within normal limits  URINALYSIS, MICROSCOPIC (REFLEX) - Abnormal; Notable for the following components:   Bacteria, UA MANY (*)    All other components within normal limits    EKG None  Radiology No results found.  Procedures Procedures   Medications Ordered in ED Medications - No data to display  ED Course/ Medical Decision Making/ A&P                           Medical Decision Making Amount and/or Complexity of Data Reviewed Labs: ordered.  Risk Prescription drug management.    MDM  This is a 56 y.o. female who presents to the ED with dysuria The differential of this patient includes but is not limited to UTI, Pyelonephritis  My Impression, Plan, and ED Course:  So well-appearing 56 year old female.  She is afebrile and is hemodynamically stable.  She presents with dysuria for 1 week that has been worsening.  She had a recent urinary tract infection in 02/28/2020 but has not had one other than that.  She says it feels similar but is not as severe as the prior one.  Has no other systemic symptoms.  Abdomen is soft and nontender with no CVA tenderness.  I doubt this is Pyelo. She is not septic.  I personally ordered, reviewed, and interpreted all laboratory work and imaging and agree with radiologist interpretation. Results interpreted below: Urinalysis is without any hematuria.  There is small leukocytes with many bacteria.  2150 white blood cells.  This is consistent  with urinary tract infection.  Reviewed prior urine cultures from 2022 which grew out E. coli.  This was sensitive to nitrofurantoin.  We will give her course of this as this appears to be uncomplicated.  She is given strict return precautions if symptoms worsen or do not improve.   Charting Requirements Additional history is obtained from:  Independent historian External Records from outside source obtained and reviewed including: Reviewed prior admission, prior urine cultures Social Determinants of Health:  none Pertinant PMH that complicates patient's  illness: hx of UTI  Patient Care Problems that were addressed during this visit: - UTI: Acute illness with complication Medications given in ED: n/a Reevaluation of the patient after these medicines showed that the patient stayed the same I have reviewed home medications and made changes accordingly. Sent prescription for Macrobid Critical Care Interventions: n/a Consultations: n/a Disposition: discharge with return precautions   Portions of this note were generated with Dragon dictation software. Dictation errors may occur despite best attempts at proofreading.      Final Clinical Impression(s) / ED Diagnoses Final diagnoses:  Acute cystitis without hematuria    Rx / DC Orders ED Discharge Orders          Ordered    nitrofurantoin, macrocrystal-monohydrate, (MACROBID) 100 MG capsule  2 times daily        06/18/21 2020              Marce Charlesworth, Finis Bud, PA-C 06/18/21 2026    Rolan Bucco, MD 06/18/21 2040

## 2021-06-18 NOTE — ED Notes (Signed)
D/c paperwork reviewed with pt. Including prescription.  Pt with request for prescription for nausea medication, EDP made aware. Pt with no further questions or concerns at time of d/c. Ambulatory to ED exit with family.

## 2021-06-18 NOTE — Discharge Instructions (Signed)
You have a urinary tract infection. Please pick up antibiotics and take as prescribed. If you develop fevers, lower back pain, or other worsening symptoms, please return to the emergency department.

## 2021-07-02 ENCOUNTER — Other Ambulatory Visit: Payer: Self-pay

## 2021-07-02 ENCOUNTER — Emergency Department (HOSPITAL_BASED_OUTPATIENT_CLINIC_OR_DEPARTMENT_OTHER)
Admission: EM | Admit: 2021-07-02 | Discharge: 2021-07-02 | Disposition: A | Discharge status: Home / Self Care | Payer: Managed Care, Other (non HMO) | Attending: Emergency Medicine | Admitting: Emergency Medicine

## 2021-07-02 ENCOUNTER — Encounter (HOSPITAL_BASED_OUTPATIENT_CLINIC_OR_DEPARTMENT_OTHER): Payer: Self-pay | Admitting: Emergency Medicine

## 2021-07-02 ENCOUNTER — Emergency Department (HOSPITAL_BASED_OUTPATIENT_CLINIC_OR_DEPARTMENT_OTHER): Payer: Managed Care, Other (non HMO)

## 2021-07-02 DIAGNOSIS — R1011 Right upper quadrant pain: Secondary | ICD-10-CM | POA: Diagnosis not present

## 2021-07-02 DIAGNOSIS — I10 Essential (primary) hypertension: Secondary | ICD-10-CM | POA: Diagnosis not present

## 2021-07-02 DIAGNOSIS — R1013 Epigastric pain: Secondary | ICD-10-CM | POA: Insufficient documentation

## 2021-07-02 DIAGNOSIS — Z79899 Other long term (current) drug therapy: Secondary | ICD-10-CM | POA: Diagnosis not present

## 2021-07-02 DIAGNOSIS — R1012 Left upper quadrant pain: Secondary | ICD-10-CM | POA: Insufficient documentation

## 2021-07-02 DIAGNOSIS — R109 Unspecified abdominal pain: Secondary | ICD-10-CM | POA: Diagnosis present

## 2021-07-02 LAB — COMPREHENSIVE METABOLIC PANEL
ALT: 32 U/L (ref 0–44)
AST: 27 U/L (ref 15–41)
Albumin: 3.8 g/dL (ref 3.5–5.0)
Alkaline Phosphatase: 67 U/L (ref 38–126)
Anion gap: 4 — ABNORMAL LOW (ref 5–15)
BUN: 19 mg/dL (ref 6–20)
CO2: 29 mmol/L (ref 22–32)
Calcium: 9.2 mg/dL (ref 8.9–10.3)
Chloride: 106 mmol/L (ref 98–111)
Creatinine, Ser: 0.7 mg/dL (ref 0.44–1.00)
GFR, Estimated: >60 mL/min (ref >60)
Glucose, Bld: 105 mg/dL — ABNORMAL HIGH (ref 70–99)
Potassium: 3.7 mmol/L (ref 3.5–5.1)
Sodium: 139 mmol/L (ref 135–145)
Total Bilirubin: 0.3 mg/dL (ref 0.3–1.2)
Total Protein: 7.2 g/dL (ref 6.5–8.1)

## 2021-07-02 LAB — CBC
HCT: 38.1 % (ref 36.0–46.0)
Hemoglobin: 12.1 g/dL (ref 12.0–15.0)
MCH: 28.3 pg (ref 26.0–34.0)
MCHC: 31.8 g/dL (ref 30.0–36.0)
MCV: 89.2 fL (ref 80.0–100.0)
Platelets: 273 10*3/uL (ref 150–400)
RBC: 4.27 MIL/uL (ref 3.87–5.11)
RDW: 13.5 % (ref 11.5–15.5)
WBC: 5.8 10*3/uL (ref 4.0–10.5)
nRBC: 0 % (ref 0.0–0.2)

## 2021-07-02 LAB — URINALYSIS, ROUTINE W REFLEX MICROSCOPIC
Bilirubin Urine: NEGATIVE
Glucose, UA: NEGATIVE mg/dL
Hgb urine dipstick: NEGATIVE
Ketones, ur: NEGATIVE mg/dL
Leukocytes,Ua: NEGATIVE
Nitrite: NEGATIVE
Protein, ur: NEGATIVE mg/dL
Specific Gravity, Urine: 1.02 (ref 1.005–1.030)
pH: 7 (ref 5.0–8.0)

## 2021-07-02 LAB — LIPASE, BLOOD: Lipase: 31 U/L (ref 11–51)

## 2021-07-02 MED ORDER — IOHEXOL 300 MG/ML  SOLN
100.0000 mL | Freq: Once | INTRAMUSCULAR | Status: AC | PRN
  Administered 2021-07-02: 100 mL via INTRAVENOUS

## 2021-07-02 MED ORDER — SODIUM CHLORIDE 0.9 % IV BOLUS (SEPSIS)
1000.0000 mL | Freq: Once | INTRAVENOUS | Status: AC
  Administered 2021-07-02: 1000 mL via INTRAVENOUS

## 2021-07-02 MED ORDER — PANTOPRAZOLE SODIUM 40 MG PO TBEC: 40.0000 mg | DELAYED_RELEASE_TABLET | Freq: Every day | ORAL | 0 refills | Status: AC

## 2021-07-02 MED ORDER — IOHEXOL 9 MG/ML PO SOLN
500.0000 mL | ORAL | Status: DC
  Administered 2021-07-02: 500 mL via ORAL

## 2021-07-02 MED ORDER — MORPHINE SULFATE (PF) 4 MG/ML IV SOLN
4.0000 mg | Freq: Once | INTRAVENOUS | Status: AC
  Administered 2021-07-02: 4 mg via INTRAVENOUS
  Filled 2021-07-02: qty 1

## 2021-07-02 MED ORDER — ONDANSETRON 4 MG PO TBDP: 4.0000 mg | ORAL_TABLET | Freq: Three times a day (TID) | ORAL | 0 refills | Status: DC | PRN

## 2021-07-02 MED ORDER — ONDANSETRON HCL 4 MG/2ML IJ SOLN
4.0000 mg | Freq: Once | INTRAMUSCULAR | Status: AC
  Administered 2021-07-02: 4 mg via INTRAVENOUS
  Filled 2021-07-02: qty 2

## 2021-07-02 NOTE — ED Notes (Signed)
Patient states that she has some nausea . States that she is having abdominal pain.Abdominal is tender

## 2021-09-20 ENCOUNTER — Ambulatory Visit (INDEPENDENT_AMBULATORY_CARE_PROVIDER_SITE_OTHER): Payer: Managed Care, Other (non HMO)

## 2021-09-20 ENCOUNTER — Ambulatory Visit (INDEPENDENT_AMBULATORY_CARE_PROVIDER_SITE_OTHER): Payer: Managed Care, Other (non HMO) | Admitting: Podiatry

## 2021-09-20 DIAGNOSIS — M2041 Other hammer toe(s) (acquired), right foot: Secondary | ICD-10-CM

## 2021-09-20 DIAGNOSIS — M2042 Other hammer toe(s) (acquired), left foot: Secondary | ICD-10-CM

## 2021-09-20 NOTE — Progress Notes (Signed)
   Chief Complaint  Patient presents with   Foot Problem    Hammertoe - Surgery    HPI: 56 y.o. female presenting today as a new patient to our practice for second opinion and further evaluation of symptomatic hammertoes to the bilateral feet, left greater than the right.  Patient states that she has seen multiple previous physicians in the past regarding her symptomatic painful hammertoes.  She has changed her shoes multiple times and purchased multiple shoes to try to alleviate symptoms caused by the hammertoes with minimal improvement or relief.  She presents today with her friend to discuss hammertoe surgery and for further treatment and evaluation  Past Medical History:  Diagnosis Date   Arrhythmia 05/26/09   ECHO ALL CHAMBERS  ARE NORMAL IN SIZE AND FUNCTION EF70%   Cervical spondylosis 12/20/2017   GERD (gastroesophageal reflux disease)    Hyperlipidemia    Hypertension    Obstructive sleep apnea    currently wearing CPAP   Seasonal allergies    Past Surgical History:  Procedure Laterality Date   ADENOIDECTOMY     BLADDER SUSPENSION     DEBRIDEMENT TENNIS ELBOW     ELBOW SURGERY     bilateral tendon surgery   PATELLA RECONSTRUCTION Left    TONSILLECTOMY     No Known Allergies   Objective: Physical Exam General: The patient is alert and oriented x3 in no acute distress.  Dermatology: Skin is cool, dry and supple bilateral lower extremities. Negative for open lesions or macerations.  Vascular: Palpable pedal pulses bilaterally. No edema or erythema noted. Capillary refill within normal limits.  Neurological: Epicritic and protective threshold grossly intact bilaterally.   Musculoskeletal Exam: All pedal and ankle joints range of motion within normal limits bilateral. Muscle strength 5/5 in all groups bilateral. Hammertoe contracture deformity noted to the lesser digits of the bilateral feet  Radiographic Exam B/L feet 09/20/2021: Hammertoe contracture deformity noted  to the interphalangeal joints and MPJ of the respective hammertoe digits mentioned on clinical musculoskeletal exam.     Assessment: 1.  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 LT > RT   Plan of Care:  1. Patient evaluated. X-Rays reviewed.  2.  Today we discussed both conservative and surgical treatment modalities to the patient's hammertoe deformities.  She continues to have pain and tenderness on a daily basis despite shoe gear modifications.  She has been dealing with the hammertoe pain for several years now.  Patient states that she is now ready for surgery.  Risk benefits advantages and disadvantages were all explained.  No guarantees were expressed or implied.  All patient questions were answered. 3.  Authorization for surgery was initiated today.  Surgery will consist of hammertoe Arthroplasty repair digits 2, 3, 4, possible 5 left foot with percutaneous pin fixation 4.  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 Felecia Shelling, DPM    [address] [place], [place] [address]                Office [phone]  Fax [phone]

## 2021-10-24 ENCOUNTER — Telehealth: Payer: Self-pay | Admitting: Podiatry

## 2021-10-24 NOTE — Telephone Encounter (Signed)
DOS: 11/23/2021  [employer]  Procedure: Hammertoe Repair 2-5 Lt [phone]) DX: M20.42  Individual Deductible: $1,300 with $0 remaining. Family Deductible: $3,900 with $1,300 met. Individual OOP: $3,200 with $3,134.88 met. Family OOP: $9,600 with $3,134.88 met. CoInsurance: 20%  No Prior Authorization is Required per [facility] automated system.  Call Reference #: [id number]

## 2021-10-27 ENCOUNTER — Encounter: Payer: Self-pay | Admitting: Podiatry

## 2021-11-07 ENCOUNTER — Ambulatory Visit (INDEPENDENT_AMBULATORY_CARE_PROVIDER_SITE_OTHER): Payer: Managed Care, Other (non HMO) | Admitting: Podiatry

## 2021-11-07 DIAGNOSIS — M2042 Other hammer toe(s) (acquired), left foot: Secondary | ICD-10-CM

## 2021-11-07 DIAGNOSIS — M2041 Other hammer toe(s) (acquired), right foot: Secondary | ICD-10-CM | POA: Diagnosis not present

## 2021-11-07 NOTE — Progress Notes (Signed)
   Chief Complaint  Patient presents with   Foot Problem    Surgery consult    HPI: 56 y.o. female presenting today for follow-up evaluation and consult for her upcoming surgery.  Patient had some questions today and would like to discuss in detail the surgery again.  She also has a very symptomatic toenail to the left second toe.  She is requesting that during the surgery we remove the toenail permanently.  She would like to discuss this as an option.  She presents for further treatment and evaluation  Past Medical History:  Diagnosis Date   Arrhythmia 05/26/09   ECHO ALL CHAMBERS  ARE NORMAL IN SIZE AND FUNCTION EF70%   Cervical spondylosis 12/20/2017   GERD (gastroesophageal reflux disease)    Hyperlipidemia    Hypertension    Obstructive sleep apnea    currently wearing CPAP   Seasonal allergies    Past Surgical History:  Procedure Laterality Date   ADENOIDECTOMY     BLADDER SUSPENSION     DEBRIDEMENT TENNIS ELBOW     ELBOW SURGERY     bilateral tendon surgery   PATELLA RECONSTRUCTION Left    TONSILLECTOMY     No Known Allergies   Objective: Physical Exam General: The patient is alert and oriented x3 in no acute distress.  Dermatology: Skin is cool, dry and supple bilateral lower extremities. Negative for open lesions or macerations.  There is a symptomatic hyperkeratotic discolored toenail noted to the left second toe  Vascular: Palpable pedal pulses bilaterally. No edema or erythema noted. Capillary refill within normal limits.  Neurological: Epicritic and protective threshold grossly intact bilaterally.   Musculoskeletal Exam: All pedal and ankle joints range of motion within normal limits bilateral. Muscle strength 5/5 in all groups bilateral. Hammertoe contracture deformity noted to the lesser digits of the bilateral feet  Radiographic Exam B/L feet 09/20/2021: Hammertoe contracture deformity noted to the interphalangeal joints and MPJ of the respective hammertoe  digits mentioned on clinical musculoskeletal exam.     Assessment: 1.  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 LT > RT 2.  Symptomatic dystrophic toenail left second digit  Plan of Care:  1. Patient evaluated. X-Rays reviewed again today.  All patient questions were answered and surgery as well as details of the procedure and postoperative recovery course were explained 2.  Patient is ready for surgery.  She states that she has made preparations for work and for appropriate postoperative recovery.   3.  Authorization for surgery was initiated today to add total permanent nail avulsion to the left second toe.  Remaining consent was obtained last visit on 09/20/2021 surgery will consist of hammertoe Arthroplasty repair digits 2, 3, 4, possible 5 left foot with percutaneous pin fixation. 4.  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 Edrick Kins, DPM    [address], [place] [address]                Office [phone]  Fax [phone]

## 2021-11-14 DIAGNOSIS — M79676 Pain in unspecified toe(s): Secondary | ICD-10-CM

## 2021-11-16 ENCOUNTER — Ambulatory Visit
Admission: EM | Admit: 2021-11-16 | Discharge: 2021-11-16 | Disposition: A | Discharge status: Home / Self Care | Payer: Managed Care, Other (non HMO)

## 2021-11-16 ENCOUNTER — Encounter: Payer: Self-pay | Admitting: Emergency Medicine

## 2021-11-16 DIAGNOSIS — S99922A Unspecified injury of left foot, initial encounter: Secondary | ICD-10-CM | POA: Diagnosis not present

## 2021-11-16 NOTE — ED Triage Notes (Addendum)
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

## 2021-11-16 NOTE — ED Provider Notes (Signed)
Jessica Glass CARE    CSN: [id number] Arrival date & time: 11/16/21  1754      History   Chief Complaint Chief Complaint  Patient presents with   Foot Injury    Left     HPI Jessica Glass is a 56 y.o. female.   HPI Pleasant 56 year old female presents with left foot injury from cleaning off a trailer today.  Patient reports stepping on a rusty nail that felt go through her shoe with no puncture site noted.  Patient reports last Tdap was 2019 and would like to be evaluated as she has upcoming surgeries on left toes next week.  PMH significant for cervical spondylosis, HTN, obesity, and sleep apnea.  Past Medical History:  Diagnosis Date   Arrhythmia 05/26/09   ECHO ALL CHAMBERS  ARE NORMAL IN SIZE AND FUNCTION EF70%   Cervical spondylosis 12/20/2017   GERD (gastroesophageal reflux disease)    Hyperlipidemia    Hypertension    Obstructive sleep apnea    currently wearing CPAP   Seasonal allergies     Patient Active Problem List   Diagnosis Date Noted   Acute pyelonephritis 10/01/2020   Sepsis (HCC) 10/01/2020   UTI (urinary tract infection) 10/01/2020   Hypokalemia 10/01/2020   Chest pain 01/02/2018   Obstructive sleep apnea 04/10/2013   Hypertension    Hyperlipidemia    BACK PAIN 02/28/2007   GERD 08/21/2006   Essential hypertension 08/15/2006   ALLERGIC RHINITIS 08/15/2006    Past Surgical History:  Procedure Laterality Date   ADENOIDECTOMY     BLADDER SUSPENSION     DEBRIDEMENT TENNIS ELBOW     ELBOW SURGERY     bilateral tendon surgery   PATELLA RECONSTRUCTION Left    TONSILLECTOMY      OB History   No obstetric history on file.      Home Medications    Prior to Admission medications   Medication Sig Start Date End Date Taking? Authorizing Provider  chlorhexidine (PERIDEX) 0.12 % solution 15 mLs 2 (two) times daily. 11/01/21  Yes [provider]  montelukast (SINGULAIR) 10 MG tablet Take by mouth. 09/08/21  Yes [provider]  atorvastatin (LIPITOR) 20 MG tablet Take 20 mg by mouth daily.    [provider]  Azelastine HCl 137 MCG/SPRAY SOLN Place 1 spray into both nostrils in the morning and at bedtime. 07/13/20   [provider]  cetirizine (ZYRTEC) 10 MG tablet Take 10 mg by mouth daily.    [provider]  cholecalciferol (VITAMIN D3) 25 MCG (1000 UNIT) tablet Take 2,000 Units by mouth daily.    [provider]  fluticasone (FLONASE) 50 MCG/ACT nasal spray Place 1 spray into both nostrils in the morning and at bedtime. 07/13/20   [provider]  gabapentin (NEURONTIN) 300 MG capsule Take 300 mg by mouth 2 (two) times daily as needed (pain). 09/15/20   [provider]  Ginkgo Biloba Extract 60 MG CAPS Take 1 capsule by mouth daily.    [provider]  ondansetron (ZOFRAN) 4 MG tablet Take 1 tablet (4 mg total) by mouth every 6 (six) hours as needed for nausea or vomiting. 10/03/20   Hongalgi, Maximino Greenland, MD  ondansetron (ZOFRAN-ODT) 4 MG disintegrating tablet Take 1 tablet (4 mg total) by mouth every 8 (eight) hours as needed. 07/02/21   Long, Arlyss Repress, MD  pantoprazole (PROTONIX) 40 MG tablet Take 1 tablet (40 mg total) by mouth daily. 07/02/21  Long, Arlyss Repress, MD  valACYclovir (VALTREX) 500 MG tablet Take 500 mg by mouth daily.    [provider]    Family History Family History  Problem Relation Age of Onset   Cancer Mother        ovarian   Hypertension Mother    Diabetes Mother    Heart disease Mother    Hyperlipidemia Father    Hypertension Father    Cancer Maternal Aunt        breast   Breast cancer Paternal Aunt 6    Social History Social History   Tobacco Use   Smoking status: Former    Packs/day: 0.25    Types: Cigarettes    Quit date: 06/06/2007    Years since quitting: 14.4    Passive exposure: Never   Smokeless tobacco: Never  Vaping Use   Vaping Use: Never used  Substance Use Topics   Alcohol use: Not  Currently   Drug use: No     Allergies   Shellfish allergy   Review of Systems Review of Systems  Skin:        Possible puncture injury of the left foot     Physical Exam Triage Vital Signs ED Triage Vitals  Enc Vitals Group     BP 11/16/21 1813 111/72     Pulse Rate 11/16/21 1813 93     Resp 11/16/21 1813 16     Temp 11/16/21 1813 98.7 F (37.1 C)     Temp Source 11/16/21 1813 Oral     SpO2 11/16/21 1813 98 %     Weight 11/16/21 1814 220 lb (99.8 kg)     Height 11/16/21 1814 5\' 4"  (1.626 m)     Head Circumference --      Peak Flow --      Pain Score 11/16/21 1813 0     Pain Loc --      Pain Edu? --      Excl. in GC? --    No data found.  Updated Vital Signs BP 111/72 (BP Location: Left Arm)   Pulse 93   Temp 98.7 F (37.1 C) (Oral)   Resp 16   Ht 5\' 4"  (1.626 m)   Wt 220 lb (99.8 kg)   LMP 09/09/2015 Comment: Pt denies pregnancy-"menopausal symptoms" per pt  SpO2 98%   BMI 37.76 kg/m      Physical Exam Vitals and nursing note reviewed.  Constitutional:      Appearance: Normal appearance. She is obese.  HENT:     Head: Normocephalic and atraumatic.     Mouth/Throat:     Mouth: Mucous membranes are moist.     Pharynx: Oropharynx is clear.  Eyes:     Extraocular Movements: Extraocular movements intact.     Conjunctiva/sclera: Conjunctivae normal.     Pupils: Pupils are equal, round, and reactive to light.  Cardiovascular:     Rate and Rhythm: Normal rate and regular rhythm.     Pulses: Normal pulses.     Heart sounds: Normal heart sounds. No murmur heard. Pulmonary:     Effort: Pulmonary effort is normal.     Breath sounds: Normal breath sounds. No wheezing, rhonchi or rales.  Musculoskeletal:        General: Normal range of motion.     Cervical back: Normal range of motion and neck supple.  Skin:    General: Skin is warm and dry.  Neurological:     General: No focal deficit  present.     Mental Status: She is alert and oriented to person,  place, and time. Mental status is at baseline.      UC Treatments / Results  Labs (all labs ordered are listed, but only abnormal results are displayed) Labs Reviewed - No data to display  EKG   Radiology No results found.  Procedures Procedures (including critical care time)  Medications Ordered in UC Medications - No data to display  Initial Impression / Assessment and Plan / UC Course  I have reviewed the triage vital signs and the nursing notes.  Pertinent labs & imaging results that were available during my care of the patient were reviewed by me and considered in my medical decision making (see chart for details).     MDM: 1.  Left foot injury-no puncture wound noted on sole of foot at time of exam. Advised patient may RICE sole of left foot for discomfort 20-30 minutes 2-3 daily.  Advised patient if symptoms worsen and/or unresolved please follow-up with PCP or here for further evaluation.  Patient discharged home, hemodynamically stable. Final Clinical Impressions(s) / UC Diagnoses   Final diagnoses:  Injury of left foot, initial encounter     Discharge Instructions      Advised patient may RICE sole of left foot for discomfort 20-30 minutes 2-3 daily.  Advised patient if symptoms worsen and/or unresolved please follow-up with PCP or here for further evaluation.     ED Prescriptions   None    PDMP not reviewed this encounter.   Trevor Iha, FNP 11/16/21 [phone]

## 2021-11-16 NOTE — Discharge Instructions (Addendum)
Advised patient may RICE sole of left foot for discomfort 20-30 minutes 2-3 daily.  Advised patient if symptoms worsen and/or unresolved please follow-up with PCP or here for further evaluation.

## 2021-11-17 ENCOUNTER — Telehealth: Payer: Self-pay

## 2021-11-17 NOTE — Telephone Encounter (Signed)
TC to f/u after yesterday's visit to [facility]. No answer; left VM to call [phone] for problems or questions. 

## 2021-11-23 ENCOUNTER — Other Ambulatory Visit: Payer: Self-pay | Admitting: Podiatry

## 2021-11-23 DIAGNOSIS — M21542 Acquired clubfoot, left foot: Secondary | ICD-10-CM | POA: Diagnosis not present

## 2021-11-23 DIAGNOSIS — M2042 Other hammer toe(s) (acquired), left foot: Secondary | ICD-10-CM | POA: Diagnosis not present

## 2021-11-23 MED ORDER — IBUPROFEN 800 MG PO TABS: 800.0000 mg | ORAL_TABLET | Freq: Three times a day (TID) | ORAL | 1 refills | Status: DC

## 2021-11-23 MED ORDER — OXYCODONE-ACETAMINOPHEN 5-325 MG PO TABS: 1.0000 | ORAL_TABLET | ORAL | 0 refills | Status: DC | PRN

## 2021-11-23 NOTE — Progress Notes (Signed)
PRN postop 

## 2021-11-29 ENCOUNTER — Ambulatory Visit (INDEPENDENT_AMBULATORY_CARE_PROVIDER_SITE_OTHER): Payer: Managed Care, Other (non HMO)

## 2021-11-29 ENCOUNTER — Ambulatory Visit (INDEPENDENT_AMBULATORY_CARE_PROVIDER_SITE_OTHER): Payer: Managed Care, Other (non HMO) | Admitting: Podiatry

## 2021-11-29 DIAGNOSIS — M2041 Other hammer toe(s) (acquired), right foot: Secondary | ICD-10-CM

## 2021-11-29 DIAGNOSIS — Z9889 Other specified postprocedural states: Secondary | ICD-10-CM

## 2021-11-29 DIAGNOSIS — M2042 Other hammer toe(s) (acquired), left foot: Secondary | ICD-10-CM

## 2021-11-29 NOTE — Progress Notes (Signed)
Subjective:  Patient ID: Jessica Glass, female    DOB: 1965-08-09,  MRN: [id number]  No chief complaint on file.   DOS: 11/23/2021 Procedure: Left 2 through 5 hammertoe repair  56 y.o. female returns for post-op check.  Patient states she is doing well.  Minimal pain.  Pain is controlled with pain medication.  Known to Dr. Logan Bores.  Bandage clean dry and intact.  No infection noted  Review of Systems: Negative except as noted in the HPI. Denies N/V/F/Ch.  Past Medical History:  Diagnosis Date   Arrhythmia 05/26/09   ECHO ALL CHAMBERS  ARE NORMAL IN SIZE AND FUNCTION EF70%   Cervical spondylosis 12/20/2017   GERD (gastroesophageal reflux disease)    Hyperlipidemia    Hypertension    Obstructive sleep apnea    currently wearing CPAP   Seasonal allergies     Current Outpatient Medications:    atorvastatin (LIPITOR) 20 MG tablet, Take 20 mg by mouth daily., Disp: , Rfl:    Azelastine HCl 137 MCG/SPRAY SOLN, Place 1 spray into both nostrils in the morning and at bedtime., Disp: , Rfl:    cetirizine (ZYRTEC) 10 MG tablet, Take 10 mg by mouth daily., Disp: , Rfl:    chlorhexidine (PERIDEX) 0.12 % solution, 15 mLs 2 (two) times daily., Disp: , Rfl:    cholecalciferol (VITAMIN D3) 25 MCG (1000 UNIT) tablet, Take 2,000 Units by mouth daily., Disp: , Rfl:    fluticasone (FLONASE) 50 MCG/ACT nasal spray, Place 1 spray into both nostrils in the morning and at bedtime., Disp: , Rfl:    gabapentin (NEURONTIN) 300 MG capsule, Take 300 mg by mouth 2 (two) times daily as needed (pain)., Disp: , Rfl:    Ginkgo Biloba Extract 60 MG CAPS, Take 1 capsule by mouth daily., Disp: , Rfl:    ibuprofen (ADVIL) 800 MG tablet, Take 1 tablet (800 mg total) by mouth 3 (three) times daily., Disp: 90 tablet, Rfl: 1   montelukast (SINGULAIR) 10 MG tablet, Take by mouth., Disp: , Rfl:    ondansetron (ZOFRAN) 4 MG tablet, Take 1 tablet (4 mg total) by mouth every 6 (six) hours as needed for nausea or vomiting., Disp:  20 tablet, Rfl: 0   ondansetron (ZOFRAN-ODT) 4 MG disintegrating tablet, Take 1 tablet (4 mg total) by mouth every 8 (eight) hours as needed., Disp: 20 tablet, Rfl: 0   oxyCODONE-acetaminophen (PERCOCET) 5-325 MG tablet, Take 1 tablet by mouth every 4 (four) hours as needed for severe pain., Disp: 30 tablet, Rfl: 0   pantoprazole (PROTONIX) 40 MG tablet, Take 1 tablet (40 mg total) by mouth daily., Disp: 30 tablet, Rfl: 0   valACYclovir (VALTREX) 500 MG tablet, Take 500 mg by mouth daily., Disp: , Rfl:   Social History   Tobacco Use  Smoking Status Former   Packs/day: 0.25   Types: Cigarettes   Quit date: 06/06/2007   Years since quitting: 14.4   Passive exposure: Never  Smokeless Tobacco Never    Allergies  Allergen Reactions   Shellfish Allergy Anaphylaxis   Objective:  There were no vitals filed for this visit. There is no height or weight on file to calculate BMI. Constitutional Well developed. Well nourished.  Vascular Foot warm and well perfused. Capillary refill normal to all digits.   Neurologic Normal speech. Oriented to person, place, and time. Epicritic sensation to light touch grossly present bilaterally.  Dermatologic Skin healing well without signs of infection. Skin edges well coapted without signs of infection.  Orthopedic: Tenderness to palpation noted about the surgical site.   Radiographs: 3 views of skeletally mature the left foot:Hardware is in tact no signs of backing out or loosening noted.  Reduction of hammertoe contractures noted Assessment:   1. Status post left foot surgery   2. Hammertoes of both feet    Plan:  Patient was evaluated and treated and all questions answered.  S/p foot surgery left -Progressing as expected post-operatively. -XR: See above -WB Status: Partial to the heel weightbearing as tolerated in boot -Sutures: Intact.  No signs of dehiscence noted no complication noted. -Medications: None -Foot redressed.  No follow-ups  on file.

## 2021-12-04 ENCOUNTER — Telehealth: Payer: Self-pay | Admitting: Podiatry

## 2021-12-04 ENCOUNTER — Other Ambulatory Visit: Payer: Self-pay | Admitting: Podiatry

## 2021-12-04 MED ORDER — OXYCODONE-ACETAMINOPHEN 5-325 MG PO TABS: 1.0000 | ORAL_TABLET | ORAL | 0 refills | Status: DC | PRN

## 2021-12-04 NOTE — Progress Notes (Signed)
PRN postop 

## 2021-12-04 NOTE — Telephone Encounter (Signed)
Refill sent.  Please notify patient.  Thanks, Dr. Kosha Jaquith

## 2021-12-04 NOTE — Telephone Encounter (Signed)
Patient has been updated.

## 2021-12-04 NOTE — Telephone Encounter (Signed)
Pt was seen in the office on 11/29/21 by Dr. Allena Katz for POS 1; she stated that a Rx refill for Oxycodone has not been received by the pharmacy. She uses the [facility] in [place]. Please advise.

## 2021-12-06 ENCOUNTER — Ambulatory Visit (INDEPENDENT_AMBULATORY_CARE_PROVIDER_SITE_OTHER): Payer: Managed Care, Other (non HMO) | Admitting: Podiatry

## 2021-12-06 DIAGNOSIS — Z9889 Other specified postprocedural states: Secondary | ICD-10-CM

## 2021-12-06 NOTE — Progress Notes (Signed)
   Chief Complaint  Patient presents with   Routine Post Op    POV #2 DOS 11/23/2021 HAMMERTOE REPAIR 2-4 POSS 5 LT FOOT    Subjective:  Patient presents today status post hammertoe repair 2-5 left foot. DOS: 11/23/2021.  Patient doing well.  WBAT as instructed in the cam boot.  Mostly heel touch.  No new complaints at this time  Past Medical History:  Diagnosis Date   Arrhythmia 05/26/09   ECHO ALL CHAMBERS  ARE NORMAL IN SIZE AND FUNCTION EF70%   Cervical spondylosis 12/20/2017   GERD (gastroesophageal reflux disease)    Hyperlipidemia    Hypertension    Obstructive sleep apnea    currently wearing CPAP   Seasonal allergies     Past Surgical History:  Procedure Laterality Date   ADENOIDECTOMY     BLADDER SUSPENSION     DEBRIDEMENT TENNIS ELBOW     ELBOW SURGERY     bilateral tendon surgery   PATELLA RECONSTRUCTION Left    TONSILLECTOMY      Allergies  Allergen Reactions   Shellfish Allergy Anaphylaxis    Objective/Physical Exam Neurovascular status intact.  Skin incisions appear to be well coapted with sutures intact. No sign of infectious process noted. No dehiscence. No active bleeding noted. Moderate edema noted to the surgical extremity.  Overall good alignment of the toes  Radiographic Exam LT foot 11/29/2021:  Percutaneous fixation pins and osteotomies sites appear to be stable with routine healing.  Good alignment of the digits  Assessment: 1. s/p hammertoe repair 2-5 left. DOS: 11/23/2021   Plan of Care:  1. Patient was evaluated. X-rays reviewed taken last visit 2.  Sutures removed 3.  Continue WBAT in the cam boot 4.  Return to clinic 2 weeks for follow-up x-ray.  Ideally I would like to leave the percutaneous fixation pins in the foot for approximately 6 weeks postoperatively    Felecia Shelling, DPM [facility]  Dr. Felecia Shelling, DPM    [address] [place], [place] [address]                 Office [phone]  Fax [phone]

## 2021-12-07 ENCOUNTER — Telehealth: Payer: Self-pay | Admitting: Podiatry

## 2021-12-07 NOTE — Telephone Encounter (Signed)
Pt called stating she was to have an antibiotic cream sent in to the pharmacy yesterday but they have not gotten it. Can you please call it in to [facility] in [place] on [address]

## 2021-12-08 ENCOUNTER — Other Ambulatory Visit: Payer: Self-pay | Admitting: Podiatry

## 2021-12-08 MED ORDER — MUPIROCIN 2 % EX OINT
1.0000 | TOPICAL_OINTMENT | Freq: Two times a day (BID) | CUTANEOUS | 0 refills | Status: DC
Start: 2021-12-08 — End: 2023-01-26

## 2021-12-08 NOTE — Telephone Encounter (Signed)
Thanks

## 2021-12-09 ENCOUNTER — Other Ambulatory Visit: Payer: Self-pay | Admitting: Podiatry

## 2021-12-09 MED ORDER — DOXYCYCLINE HYCLATE 100 MG PO TABS: 100.0000 mg | ORAL_TABLET | Freq: Two times a day (BID) | ORAL | 0 refills | Status: DC

## 2021-12-10 IMAGING — MG DIGITAL SCREENING BILAT W/ TOMO W/ CAD
8 series · 8 of 24 positions shown · non-contrast
Comparison: Previous exam(s).

CLINICAL DATA: Screening.

EXAM:
DIGITAL SCREENING BILATERAL MAMMOGRAM WITH TOMO AND CAD

[L CC synth-2D]
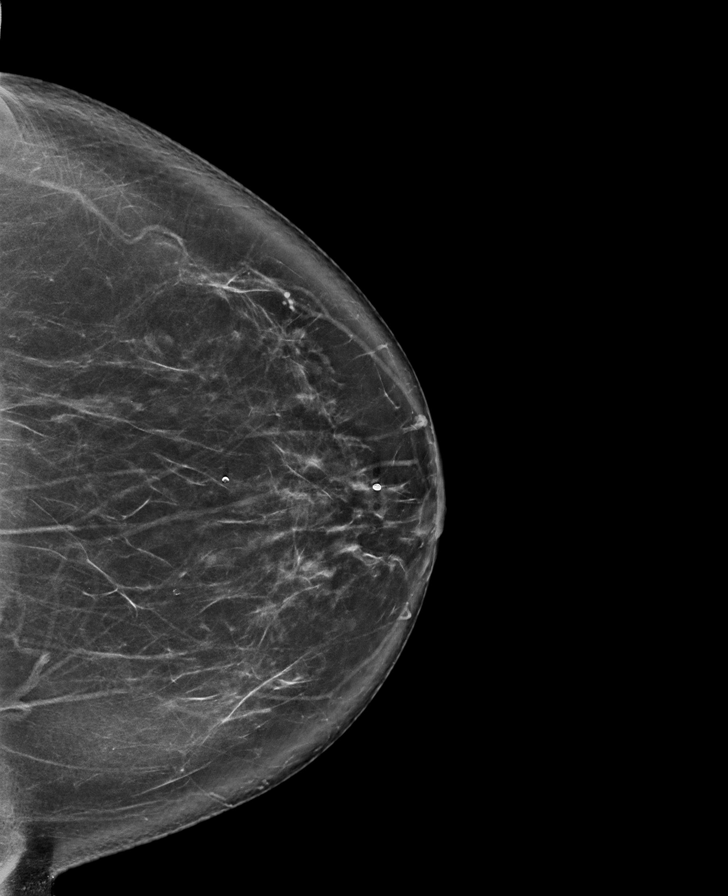

[L MLO synth-2D]
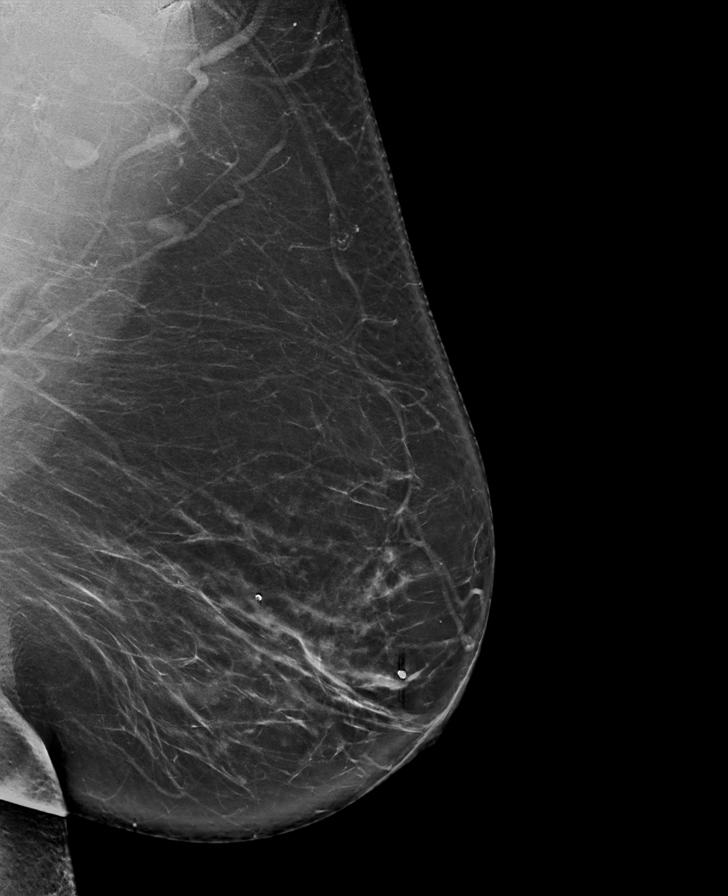

[R MLO synth-2D]
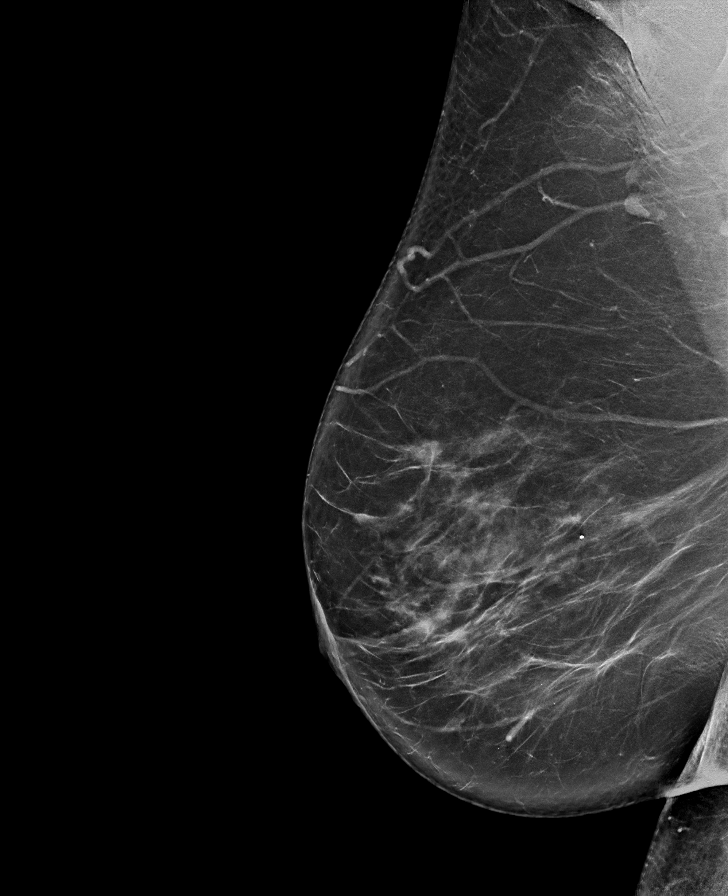

[R CC synth-2D]
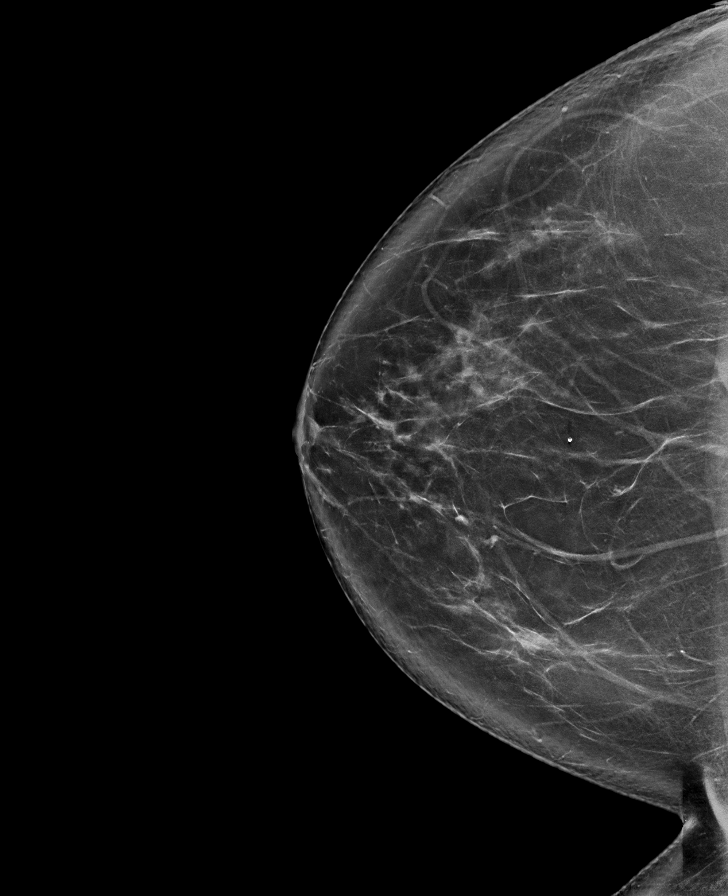

[L CC tomo · tomo slice 46/91.0]
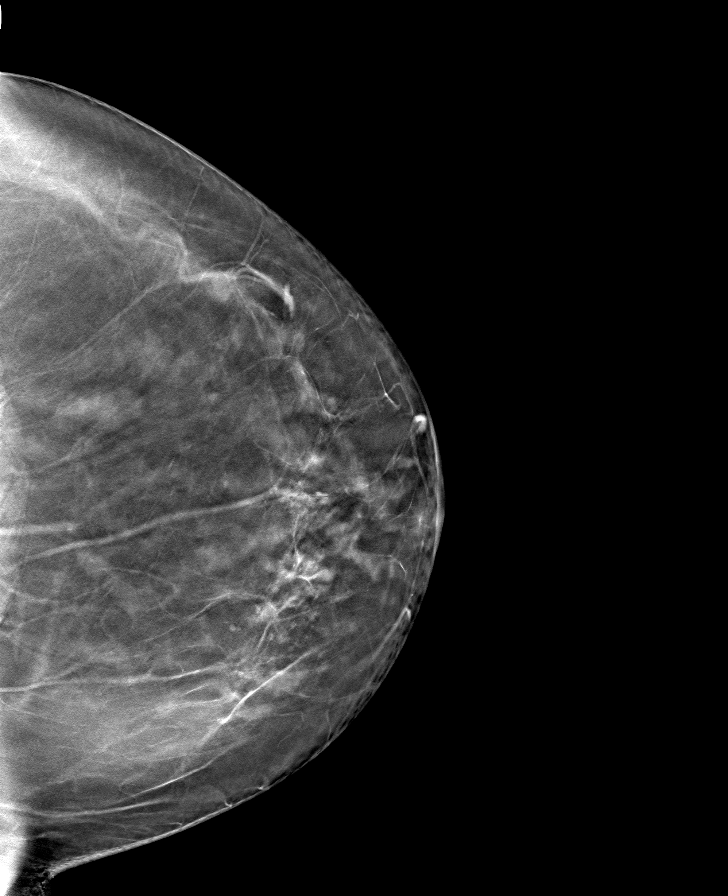

[R MLO tomo · tomo slice 47/92.0]
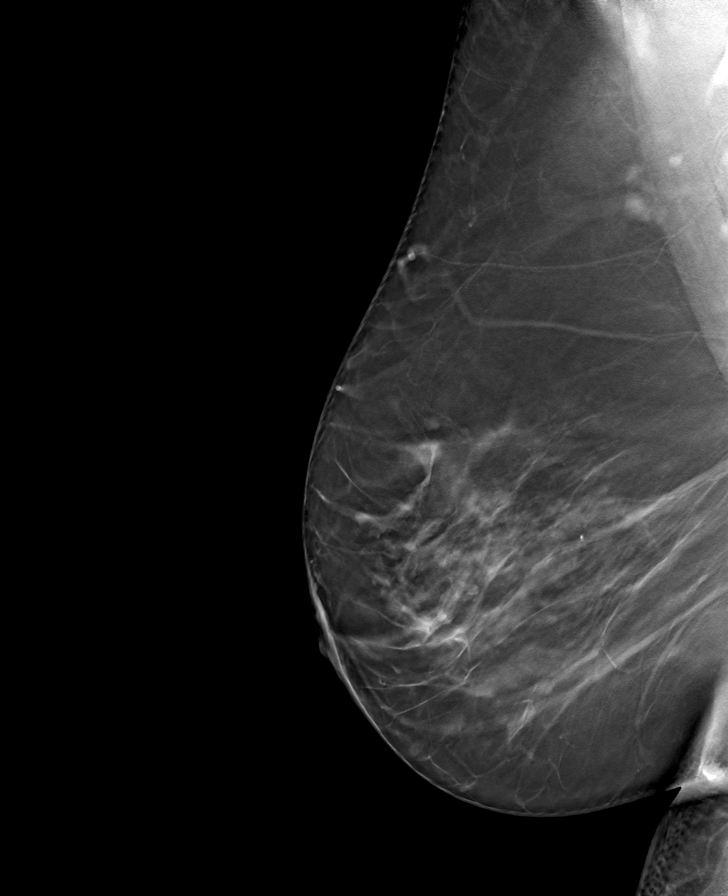

[R CC tomo · tomo slice 47/94.0]
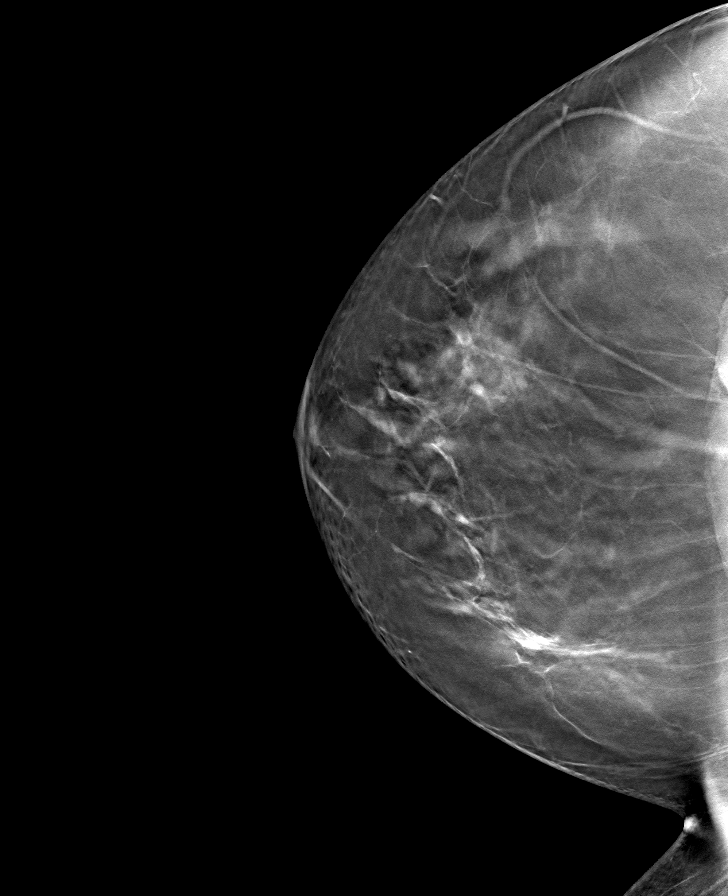

[L MLO tomo · tomo slice 49/97.0]
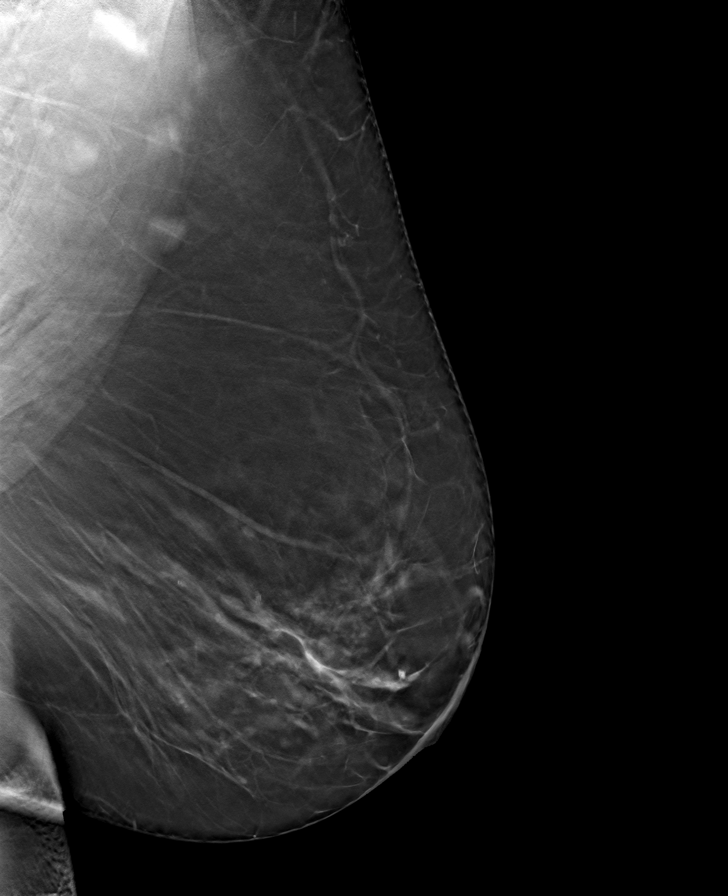

[8 of 24 positions shown; findings below may reference images not displayed]

ACR Breast Density Category b: There are scattered areas of
fibroglandular density.
FINDINGS: There are no findings suspicious for malignancy. Images were
processed with CAD.
IMPRESSION: No mammographic evidence of malignancy. A result letter of this
screening mammogram will be mailed directly to the patient.

RECOMMENDATION:
Screening mammogram in one year. (Code:[id number]-775)

BI-RADS CATEGORY  1: Negative.

## 2021-12-20 ENCOUNTER — Ambulatory Visit (INDEPENDENT_AMBULATORY_CARE_PROVIDER_SITE_OTHER): Payer: Managed Care, Other (non HMO)

## 2021-12-20 ENCOUNTER — Ambulatory Visit: Payer: Managed Care, Other (non HMO)

## 2021-12-20 DIAGNOSIS — Z9889 Other specified postprocedural states: Secondary | ICD-10-CM | POA: Diagnosis not present

## 2021-12-20 NOTE — Progress Notes (Signed)
   No chief complaint on file.   Subjective:  Patient presents today status post hammertoe repair 2-5 left foot. DOS: 11/23/2021.  Patient doing well.  WBAT as instructed in the cam boot.  Mostly heel touch.  No new complaints at this time  Past Medical History:  Diagnosis Date   Arrhythmia 05/26/09   ECHO ALL CHAMBERS  ARE NORMAL IN SIZE AND FUNCTION EF70%   Cervical spondylosis 12/20/2017   GERD (gastroesophageal reflux disease)    Hyperlipidemia    Hypertension    Obstructive sleep apnea    currently wearing CPAP   Seasonal allergies     Past Surgical History:  Procedure Laterality Date   ADENOIDECTOMY     BLADDER SUSPENSION     DEBRIDEMENT TENNIS ELBOW     ELBOW SURGERY     bilateral tendon surgery   PATELLA RECONSTRUCTION Left    TONSILLECTOMY      Allergies  Allergen Reactions   Shellfish Allergy Anaphylaxis    Objective/Physical Exam Neurovascular status intact.  Skin incisions nicely healed.  No dehiscence noted.  The percutaneous fixation pins are intact and there is good rectus alignment of the toes.  No erythema or edema or clinical indication or concern for infection  Radiographic Exam LT foot 12/20/2021:  Percutaneous fixation pins and osteotomies sites appear to be stable with routine healing.  Good alignment of the digits  Assessment: 1. s/p hammertoe repair 2-5 left. DOS: 11/23/2021   Plan of Care:  1. Patient was evaluated. X-rays reviewed taken last visit 2.  The percutaneous fixation pin to the fifth toe was palpable along the lateral aspect of the foot.  Decision made to remove this percutaneous pin.  With this was removed and a Band-Aid was applied 3.  Continue WBAT in the cam boot 4.  Return to clinic 2 weeks for remaining percutaneous fixation pin removal    Felecia Shelling, DPM [facility]  Dr. Felecia Shelling, DPM    [address] [place], [place] [address]                Office [phone]  Fax [phone]

## 2022-01-03 ENCOUNTER — Ambulatory Visit (INDEPENDENT_AMBULATORY_CARE_PROVIDER_SITE_OTHER): Payer: Managed Care, Other (non HMO) | Admitting: Podiatry

## 2022-01-03 DIAGNOSIS — Z9889 Other specified postprocedural states: Secondary | ICD-10-CM

## 2022-01-03 NOTE — Progress Notes (Signed)
   Chief Complaint  Patient presents with   Post-op Follow-up    Patient is here for post op follow-up left foot DOS 11/23/21.    Subjective:  Patient presents today status post hammertoe repair 2-5 left foot. DOS: 11/23/2021.  Patient doing well.  She continues to be WBAT cam boot.  No new complaints at this time Past Medical History:  Diagnosis Date   Arrhythmia 05/26/09   ECHO ALL CHAMBERS  ARE NORMAL IN SIZE AND FUNCTION EF70%   Cervical spondylosis 12/20/2017   GERD (gastroesophageal reflux disease)    Hyperlipidemia    Hypertension    Obstructive sleep apnea    currently wearing CPAP   Seasonal allergies     Past Surgical History:  Procedure Laterality Date   ADENOIDECTOMY     BLADDER SUSPENSION     DEBRIDEMENT TENNIS ELBOW     ELBOW SURGERY     bilateral tendon surgery   PATELLA RECONSTRUCTION Left    TONSILLECTOMY      Allergies  Allergen Reactions   Shellfish Allergy Anaphylaxis    Objective/Physical Exam Neurovascular status intact.  Skin incisions nicely healed.  No dehiscence noted.  The percutaneous fixation pins are intact and there is good rectus alignment of the toes.  No erythema or edema or clinical indication or concern for infection  Radiographic Exam LT foot 12/20/2021:  Percutaneous fixation pins and osteotomies sites appear to be stable with routine healing.  Good alignment of the digits  Assessment: 1. s/p hammertoe repair 2-5 left. DOS: 11/23/2021   Plan of Care:  1. Patient was evaluated. 2.  Percutaneous fixation pins removed today 3.  DC cam boot.  WBAT postsurgical shoe which was dispensed today 4.  Return to clinic 4 weeks for follow-up x-ray and to discuss return to work  *Works for [employer], DPM [facility]  Dr. Felecia Shelling, DPM    [address] [place], [place] [address]                Office [phone]  Fax [phone]

## 2022-01-29 ENCOUNTER — Other Ambulatory Visit: Payer: Self-pay | Admitting: Obstetrics and Gynecology

## 2022-01-29 DIAGNOSIS — Z1231 Encounter for screening mammogram for malignant neoplasm of breast: Secondary | ICD-10-CM

## 2022-01-31 ENCOUNTER — Ambulatory Visit (INDEPENDENT_AMBULATORY_CARE_PROVIDER_SITE_OTHER): Payer: 59

## 2022-01-31 ENCOUNTER — Ambulatory Visit (INDEPENDENT_AMBULATORY_CARE_PROVIDER_SITE_OTHER): Payer: 59 | Admitting: Podiatry

## 2022-01-31 ENCOUNTER — Encounter: Payer: Self-pay | Admitting: Podiatry

## 2022-01-31 DIAGNOSIS — Z9889 Other specified postprocedural states: Secondary | ICD-10-CM

## 2022-01-31 NOTE — Progress Notes (Signed)
   Chief Complaint  Patient presents with   Routine Post Op    POV #5 DOS 11/23/2021 HAMMERTOE REPAIR 2-4 POSS 5 LT FOOT, patient has some pain, NO N/V/F/C/SOB, X-Rays taken today     Subjective:  Patient presents today status post hammertoe repair 2-5 left foot. DOS: 11/23/2021.  Patient doing well.  WBAT in the postsurgical shoe.  No new complaints at this time  Past Medical History:  Diagnosis Date   Arrhythmia 05/26/09   ECHO ALL CHAMBERS  ARE NORMAL IN SIZE AND FUNCTION EF70%   Cervical spondylosis 12/20/2017   GERD (gastroesophageal reflux disease)    Hyperlipidemia    Hypertension    Obstructive sleep apnea    currently wearing CPAP   Seasonal allergies     Past Surgical History:  Procedure Laterality Date   ADENOIDECTOMY     BLADDER SUSPENSION     DEBRIDEMENT TENNIS ELBOW     ELBOW SURGERY     bilateral tendon surgery   PATELLA RECONSTRUCTION Left    TONSILLECTOMY      Allergies  Allergen Reactions   Shellfish Allergy Anaphylaxis    Objective/Physical Exam Neurovascular status intact.  Skin incisions nicely healed.  Good alignment of the toes.  There is some slight limited range of motion of the MTP joints.  No edema  Radiographic Exam LT foot 12/31/2021:  Percutaneous fixation pins and osteotomies sites appear to be stable with routine healing.  Good alignment of the digits  Assessment: 1. s/p hammertoe repair 2-5 left. DOS: 11/23/2021   Plan of Care:  1. Patient was evaluated. 2.  Discontinue postop shoe.  Recommend good supportive tennis shoes and sneakers 3.  Slowly increase to full activity no restrictions in the next 3 weeks 4.  Plan for return to work 3 weeks full activity no restrictions.  If the patient returns to work with pain and tenderness and inability to perform her job functions we will write a note for light duty x 4 weeks 5.  Return to clinic as needed  *Works for [employer], DPM [facility]  Dr. Edrick Kins, DPM    [address] [place], [place] [address]                Office [phone]  Fax [phone]

## 2022-02-20 ENCOUNTER — Telehealth: Payer: Self-pay | Admitting: Podiatry

## 2022-02-20 ENCOUNTER — Encounter: Payer: Self-pay | Admitting: Podiatry

## 2022-02-20 NOTE — Telephone Encounter (Signed)
Dr. Amalia Hailey the patient is in the office asking about the form again. She would like for you to give her a call, because she needs it for her [place].

## 2022-02-20 NOTE — Telephone Encounter (Signed)
Spoke with patient. I have the paperwork filled out. Unfortunately no fax over here at [facility] office. I'll bring it up to you first thing tomorrow morning. Patient also wants a copy. Thanks, Dr. Amalia Hailey

## 2022-02-21 ENCOUNTER — Ambulatory Visit: Payer: 59

## 2022-03-01 ENCOUNTER — Ambulatory Visit (INDEPENDENT_AMBULATORY_CARE_PROVIDER_SITE_OTHER): Payer: 59

## 2022-03-01 DIAGNOSIS — Z1231 Encounter for screening mammogram for malignant neoplasm of breast: Secondary | ICD-10-CM | POA: Diagnosis not present

## 2022-04-07 ENCOUNTER — Encounter (HOSPITAL_BASED_OUTPATIENT_CLINIC_OR_DEPARTMENT_OTHER): Payer: Self-pay | Admitting: Emergency Medicine

## 2022-04-07 DIAGNOSIS — R1012 Left upper quadrant pain: Secondary | ICD-10-CM | POA: Diagnosis present

## 2022-04-07 DIAGNOSIS — Z87891 Personal history of nicotine dependence: Secondary | ICD-10-CM | POA: Diagnosis not present

## 2022-04-07 DIAGNOSIS — K5790 Diverticulosis of intestine, part unspecified, without perforation or abscess without bleeding: Secondary | ICD-10-CM | POA: Insufficient documentation

## 2022-04-07 DIAGNOSIS — I10 Essential (primary) hypertension: Secondary | ICD-10-CM | POA: Diagnosis not present

## 2022-04-07 LAB — COMPREHENSIVE METABOLIC PANEL
ALT: 25 U/L (ref 0–44)
AST: 27 U/L (ref 15–41)
Albumin: 3.8 g/dL (ref 3.5–5.0)
Alkaline Phosphatase: 67 U/L (ref 38–126)
Anion gap: 9 (ref 5–15)
BUN: 20 mg/dL (ref 6–20)
CO2: 27 mmol/L (ref 22–32)
Calcium: 8.7 mg/dL — ABNORMAL LOW (ref 8.9–10.3)
Chloride: 101 mmol/L (ref 98–111)
Creatinine, Ser: 0.59 mg/dL (ref 0.44–1.00)
GFR, Estimated: >60 mL/min (ref >60)
Glucose, Bld: 99 mg/dL (ref 70–99)
Potassium: 3.6 mmol/L (ref 3.5–5.1)
Sodium: 137 mmol/L (ref 135–145)
Total Bilirubin: 0.5 mg/dL (ref 0.3–1.2)
Total Protein: 7.1 g/dL (ref 6.5–8.1)

## 2022-04-07 LAB — CBC WITH DIFFERENTIAL/PLATELET
Abs Immature Granulocytes: 0.02 10*3/uL (ref 0.00–0.07)
Basophils Absolute: 0.1 10*3/uL (ref 0.0–0.1)
Basophils Relative: 1 %
Eosinophils Absolute: 0.3 10*3/uL (ref 0.0–0.5)
Eosinophils Relative: 4 %
HCT: 36.9 % (ref 36.0–46.0)
Hemoglobin: 12.1 g/dL (ref 12.0–15.0)
Immature Granulocytes: 0 %
Lymphocytes Relative: 24 %
Lymphs Abs: 1.8 10*3/uL (ref 0.7–4.0)
MCH: 29.2 pg (ref 26.0–34.0)
MCHC: 32.8 g/dL (ref 30.0–36.0)
MCV: 88.9 fL (ref 80.0–100.0)
Monocytes Absolute: 0.7 10*3/uL (ref 0.1–1.0)
Monocytes Relative: 10 %
Neutro Abs: 4.8 10*3/uL (ref 1.7–7.7)
Neutrophils Relative %: 61 %
Platelets: 273 10*3/uL (ref 150–400)
RBC: 4.15 MIL/uL (ref 3.87–5.11)
RDW: 13.8 % (ref 11.5–15.5)
WBC: 7.7 10*3/uL (ref 4.0–10.5)
nRBC: 0 % (ref 0.0–0.2)

## 2022-04-07 LAB — URINALYSIS, ROUTINE W REFLEX MICROSCOPIC
Bilirubin Urine: NEGATIVE
Glucose, UA: NEGATIVE mg/dL
Hgb urine dipstick: NEGATIVE
Ketones, ur: NEGATIVE mg/dL
Nitrite: NEGATIVE
Protein, ur: NEGATIVE mg/dL
Specific Gravity, Urine: 1.02 (ref 1.005–1.030)
pH: 7 (ref 5.0–8.0)

## 2022-04-07 LAB — URINALYSIS, MICROSCOPIC (REFLEX)

## 2022-04-07 NOTE — ED Triage Notes (Signed)
Pt reports LT mid- lower abd pain that radiates around to back; getting worse throughout the day; also c/o foul-smelling urine

## 2022-04-08 ENCOUNTER — Emergency Department (HOSPITAL_BASED_OUTPATIENT_CLINIC_OR_DEPARTMENT_OTHER): Payer: 59

## 2022-04-08 ENCOUNTER — Encounter (HOSPITAL_BASED_OUTPATIENT_CLINIC_OR_DEPARTMENT_OTHER): Payer: Self-pay

## 2022-04-08 ENCOUNTER — Emergency Department (HOSPITAL_BASED_OUTPATIENT_CLINIC_OR_DEPARTMENT_OTHER)
Admission: EM | Admit: 2022-04-08 | Discharge: 2022-04-08 | Disposition: A | Discharge status: Home / Self Care | Payer: 59 | Attending: Emergency Medicine | Admitting: Emergency Medicine

## 2022-04-08 DIAGNOSIS — R1012 Left upper quadrant pain: Secondary | ICD-10-CM

## 2022-04-08 DIAGNOSIS — K5790 Diverticulosis of intestine, part unspecified, without perforation or abscess without bleeding: Secondary | ICD-10-CM

## 2022-04-08 MED ORDER — IOHEXOL 300 MG/ML  SOLN
100.0000 mL | Freq: Once | INTRAMUSCULAR | Status: AC | PRN
  Administered 2022-04-08: 100 mL via INTRAVENOUS

## 2022-04-08 NOTE — ED Notes (Signed)
Patient transported to CT 

## 2022-04-08 NOTE — ED Provider Notes (Signed)
[facility] Provider Note: Georgena Spurling, MD, FACEP  CSN: [id number] MRN: [id number] ARRIVAL: 04/07/22 at 2038 ROOM: [facility]  Abdominal Pain   HISTORY OF PRESENT ILLNESS  04/08/22 12:29 AM Jessica Glass is a 57 y.o. female who fell about 2 weeks ago onto her back.  Since that time she has had a pain across her left upper quadrant radiating around to the left flank.  This pain is in the abdomen below the ribs.  This has acutely worsened over the past several days.  She is not sure if the new pain is related to the fall.  The pain is about an 8 out of 10 but is not worse with palpation.  It is somewhat worse with movement.  She has noticed an abnormal odor to her urine but no difficulty urinating and no hematuria.   Past Medical History:  Diagnosis Date   Arrhythmia 05/26/09   ECHO ALL CHAMBERS  ARE NORMAL IN SIZE AND FUNCTION EF70%   Cervical spondylosis 12/20/2017   GERD (gastroesophageal reflux disease)    Hyperlipidemia    Hypertension    Obstructive sleep apnea    currently wearing CPAP   Seasonal allergies     Past Surgical History:  Procedure Laterality Date   ADENOIDECTOMY     BLADDER SUSPENSION     DEBRIDEMENT TENNIS ELBOW     ELBOW SURGERY     bilateral tendon surgery   PATELLA RECONSTRUCTION Left    TONSILLECTOMY      Family History  Problem Relation Age of Onset   Cancer Mother        ovarian   Hypertension Mother    Diabetes Mother    Heart disease Mother    Hyperlipidemia Father    Hypertension Father    Cancer Maternal Aunt        breast   Breast cancer Paternal Aunt 37    Social History   Tobacco Use   Smoking status: Former    Packs/day: .25    Types: Cigarettes    Quit date: 06/06/2007    Years since quitting: 14.8    Passive exposure: Never   Smokeless tobacco: Never  Vaping Use   Vaping Use: Never used  Substance Use Topics   Alcohol use: Not Currently   Drug use: No    Prior to Admission  medications   Medication Sig Start Date End Date Taking? Authorizing Provider  atorvastatin (LIPITOR) 20 MG tablet Take 20 mg by mouth daily.    [provider]  Azelastine HCl 137 MCG/SPRAY SOLN Place 1 spray into both nostrils in the morning and at bedtime. 07/13/20   [provider]  cetirizine (ZYRTEC) 10 MG tablet Take 10 mg by mouth daily.    [provider]  chlorhexidine (PERIDEX) 0.12 % solution 15 mLs 2 (two) times daily. 11/01/21   [provider]  cholecalciferol (VITAMIN D3) 25 MCG (1000 UNIT) tablet Take 2,000 Units by mouth daily.    [provider]  doxycycline (VIBRA-TABS) 100 MG tablet Take 1 tablet (100 mg total) by mouth 2 (two) times daily. 12/09/21   Felipa Furnace, DPM  fluticasone (FLONASE) 50 MCG/ACT nasal spray Place 1 spray into both nostrils in the morning and at bedtime. 07/13/20   [provider]  gabapentin (NEURONTIN) 300 MG capsule Take 300 mg by mouth 2 (two) times daily as needed (pain). 09/15/20   [provider]  Ginkgo Biloba Extract 60 MG  CAPS Take 1 capsule by mouth daily.    [provider]  ibuprofen (ADVIL) 800 MG tablet Take 1 tablet (800 mg total) by mouth 3 (three) times daily. 11/23/21   Edrick Kins, DPM  montelukast (SINGULAIR) 10 MG tablet Take by mouth. 09/08/21   [provider]  mupirocin ointment (BACTROBAN) 2 % Apply 1 Application topically 2 (two) times daily. 12/08/21   Felipa Furnace, DPM  oxyCODONE-acetaminophen (PERCOCET) 5-325 MG tablet Take 1 tablet by mouth every 4 (four) hours as needed for severe pain. 12/04/21   Edrick Kins, DPM  pantoprazole (PROTONIX) 40 MG tablet Take 1 tablet (40 mg total) by mouth daily. 07/02/21   Long, Wonda Olds, MD  valACYclovir (VALTREX) 500 MG tablet Take 500 mg by mouth daily.    [provider]    Allergies Shellfish allergy   REVIEW OF SYSTEMS  Negative except as noted here or in the History of Present  Illness.   PHYSICAL EXAMINATION  Initial Vital Signs Blood pressure (!) 146/79, pulse 90, temperature 98 F (36.7 C), temperature source Oral, resp. rate 18, height 5\' 5"  (1.651 m), weight 95.3 kg, last menstrual period 09/09/2015, SpO2 97 %.  Examination General: Well-developed, well-nourished female in no acute distress; appearance consistent with age of record HENT: normocephalic; atraumatic Eyes: Normal appearance Neck: supple Heart: regular rate and rhythm Lungs: clear to auscultation bilaterally Abdomen: soft; nondistended; nontender; bowel sounds present Extremities: No deformity; full range of motion; pulses normal Neurologic: Awake, alert and oriented; motor function intact in all extremities and symmetric; no facial droop Skin: Warm and dry Psychiatric: Normal mood and affect   RESULTS  Summary of this visit's results, reviewed and interpreted by myself:   EKG Interpretation  Date/Time:    Ventricular Rate:    PR Interval:    QRS Duration:   QT Interval:    QTC Calculation:   R Axis:     Text Interpretation:         Laboratory Studies: Results for orders placed or performed during the hospital encounter of 04/08/22 (from the past 24 hour(s))  Urinalysis, Routine w reflex microscopic -Urine, Clean Catch     Status: Abnormal   Collection Time: 04/07/22  9:53 PM  Result Value Ref Range   Color, Urine YELLOW YELLOW   APPearance CLEAR CLEAR   Specific Gravity, Urine 1.020 1.005 - 1.030   pH 7.0 5.0 - 8.0   Glucose, UA NEGATIVE NEGATIVE mg/dL   Hgb urine dipstick NEGATIVE NEGATIVE   Bilirubin Urine NEGATIVE NEGATIVE   Ketones, ur NEGATIVE NEGATIVE mg/dL   Protein, ur NEGATIVE NEGATIVE mg/dL   Nitrite NEGATIVE NEGATIVE   Leukocytes,Ua TRACE (A) NEGATIVE  Urinalysis, Microscopic (reflex)     Status: Abnormal   Collection Time: 04/07/22  9:53 PM  Result Value Ref Range   RBC / HPF 0-5 0 - 5 RBC/hpf   WBC, UA 0-5 0 - 5 WBC/hpf   Bacteria, UA RARE (A) NONE  SEEN   Squamous Epithelial / HPF 0-5 0 - 5 /HPF  CBC with Differential     Status: None   Collection Time: 04/07/22 10:34 PM  Result Value Ref Range   WBC 7.7 4.0 - 10.5 K/uL   RBC 4.15 3.87 - 5.11 MIL/uL   Hemoglobin 12.1 12.0 - 15.0 g/dL   HCT 36.9 36.0 - 46.0 %   MCV 88.9 80.0 - 100.0 fL   MCH 29.2 26.0 - 34.0 pg   MCHC 32.8 30.0 -  36.0 g/dL   RDW 13.8 11.5 - 15.5 %   Platelets 273 150 - 400 K/uL   nRBC 0.0 0.0 - 0.2 %   Neutrophils Relative % 61 %   Neutro Abs 4.8 1.7 - 7.7 K/uL   Lymphocytes Relative 24 %   Lymphs Abs 1.8 0.7 - 4.0 K/uL   Monocytes Relative 10 %   Monocytes Absolute 0.7 0.1 - 1.0 K/uL   Eosinophils Relative 4 %   Eosinophils Absolute 0.3 0.0 - 0.5 K/uL   Basophils Relative 1 %   Basophils Absolute 0.1 0.0 - 0.1 K/uL   Immature Granulocytes 0 %   Abs Immature Granulocytes 0.02 0.00 - 0.07 K/uL  Comprehensive metabolic panel     Status: Abnormal   Collection Time: 04/07/22 10:34 PM  Result Value Ref Range   Sodium 137 135 - 145 mmol/L   Potassium 3.6 3.5 - 5.1 mmol/L   Chloride 101 98 - 111 mmol/L   CO2 27 22 - 32 mmol/L   Glucose, Bld 99 70 - 99 mg/dL   BUN 20 6 - 20 mg/dL   Creatinine, Ser 0.59 0.44 - 1.00 mg/dL   Calcium 8.7 (L) 8.9 - 10.3 mg/dL   Total Protein 7.1 6.5 - 8.1 g/dL   Albumin 3.8 3.5 - 5.0 g/dL   AST 27 15 - 41 U/L   ALT 25 0 - 44 U/L   Alkaline Phosphatase 67 38 - 126 U/L   Total Bilirubin 0.5 0.3 - 1.2 mg/dL   GFR, Estimated >60 >60 mL/min   Anion gap 9 5 - 15   Imaging Studies: CT ABDOMEN PELVIS W CONTRAST  Result Date: 04/08/2022 CLINICAL DATA:  Left upper quadrant pain EXAM: CT ABDOMEN AND PELVIS WITH CONTRAST TECHNIQUE: Multidetector CT imaging of the abdomen and pelvis was performed using the standard protocol following bolus administration of intravenous contrast. RADIATION DOSE REDUCTION: This exam was performed according to the departmental dose-optimization program which includes automated exposure control, adjustment  of the mA and/or kV according to patient size and/or use of iterative reconstruction technique. CONTRAST:  130mL OMNIPAQUE IOHEXOL 300 MG/ML  SOLN COMPARISON:  07/02/2021 FINDINGS: Lower chest: No acute abnormality Hepatobiliary: No focal hepatic abnormality. Gallbladder unremarkable. Pancreas: No focal abnormality or ductal dilatation. Spleen: No focal abnormality.  Normal size. Adrenals/Urinary Tract: No adrenal abnormality. No focal renal abnormality. No stones or hydronephrosis. Urinary bladder is unremarkable. Stomach/Bowel: Diffuse colonic diverticulosis. No active diverticulitis. Stomach and small bowel decompressed, unremarkable. Vascular/Lymphatic: No evidence of aneurysm or adenopathy. Reproductive: Uterus and adnexa unremarkable.  No mass. Other: No free fluid or free air. Musculoskeletal: No acute bony abnormality. IMPRESSION: Diffuse colonic diverticulosis.  No active diverticulitis. No acute findings. Electronically Signed   By: Rolm Baptise M.D.   On: 04/08/2022 01:26    ED COURSE and MDM  Nursing notes, initial and subsequent vitals signs, including pulse oximetry, reviewed and interpreted by myself.  Vitals:   04/07/22 2055 04/07/22 2055  BP:  (!) 146/79  Pulse:  90  Resp:  18  Temp:  98 F (36.7 C)  TempSrc:  Oral  SpO2:  97%  Weight: 95.3 kg   Height: 5\' 5"  (1.651 m)    Medications  iohexol (OMNIPAQUE) 300 MG/ML solution 100 mL (100 mLs Intravenous Contrast Given 04/08/22 0118)   1:47 AM Patient advised of reassuring CT scan and laboratory studies.  She was advised that she does have colonic diverticulosis but no evidence of acute diverticulitis.  Her discomfort may be  related to her fall but there is no sign of significant injury.   PROCEDURES  Procedures   ED DIAGNOSES     ICD-10-CM   1. Left upper quadrant abdominal pain  R10.12     2. Diverticulosis  K57.90          Shantoya Geurts, Jenny Reichmann, MD 04/08/22 [phone]

## 2023-01-26 ENCOUNTER — Other Ambulatory Visit: Payer: Self-pay

## 2023-01-26 ENCOUNTER — Ambulatory Visit
Admission: EM | Admit: 2023-01-26 | Discharge: 2023-01-26 | Disposition: A | Discharge status: Home / Self Care | Payer: 59 | Attending: Family Medicine | Admitting: Family Medicine

## 2023-01-26 DIAGNOSIS — J101 Influenza due to other identified influenza virus with other respiratory manifestations: Secondary | ICD-10-CM

## 2023-01-26 LAB — POC SARS CORONAVIRUS 2 AG -  ED: SARS Coronavirus 2 Ag: NEGATIVE

## 2023-01-26 LAB — POCT INFLUENZA A/B
Influenza A, POC: POSITIVE — AB
Influenza B, POC: NEGATIVE

## 2023-01-26 MED ORDER — OSELTAMIVIR PHOSPHATE 75 MG PO CAPS: 75.0000 mg | ORAL_CAPSULE | Freq: Two times a day (BID) | ORAL | 0 refills | Status: DC

## 2023-01-26 NOTE — Discharge Instructions (Signed)
Drink lots of water Take Tylenol or ibuprofen for pain and fever Take over-the-counter cough or cold medicines as needed Call for problems

## 2023-01-26 NOTE — ED Triage Notes (Addendum)
C/o cough since tuesday, runny nose which started yesterday. Right ear irritated and throat scratchy. Cough has gradually gotten worse. Unsure if have had fever. Has taken coricidin hbp.

## 2023-01-26 NOTE — ED Provider Notes (Signed)
[facility]    CSN: [id number] Arrival date & time: 01/26/23  1350      History   Chief Complaint No chief complaint on file.   HPI Jessica Glass is a 58 y.o. female.   Patient works at [employer].  There is a lot of people sick at work.  She has had cough body aches headache fatigue since yesterday.    Past Medical History:  Diagnosis Date   Arrhythmia 05/26/09   ECHO ALL CHAMBERS  ARE NORMAL IN SIZE AND FUNCTION EF70%   Cervical spondylosis 12/20/2017   GERD (gastroesophageal reflux disease)    Hyperlipidemia    Hypertension    Obstructive sleep apnea    currently wearing CPAP   Seasonal allergies     Patient Active Problem List   Diagnosis Date Noted   Acute pyelonephritis 10/01/2020   Sepsis (HCC) 10/01/2020   UTI (urinary tract infection) 10/01/2020   Hypokalemia 10/01/2020   Chest pain 01/02/2018   Obstructive sleep apnea 04/10/2013   Hypertension    Hyperlipidemia    Backache 02/28/2007   GERD 08/21/2006   Essential hypertension 08/15/2006   Allergic rhinitis 08/15/2006    Past Surgical History:  Procedure Laterality Date   ADENOIDECTOMY     BLADDER SUSPENSION     DEBRIDEMENT TENNIS ELBOW     ELBOW SURGERY     bilateral tendon surgery   PATELLA RECONSTRUCTION Left    TONSILLECTOMY      OB History   No obstetric history on file.      Home Medications    Prior to Admission medications   Medication Sig Start Date End Date Taking? Authorizing Provider  oseltamivir (TAMIFLU) 75 MG capsule Take 1 capsule (75 mg total) by mouth every 12 (twelve) hours. 01/26/23  Yes Eustace Moore, MD  atorvastatin (LIPITOR) 20 MG tablet Take 20 mg by mouth daily.    [provider]  Azelastine HCl 137 MCG/SPRAY SOLN Place 1 spray into both nostrils in the morning and at bedtime. 07/13/20   [provider]  cetirizine (ZYRTEC) 10 MG tablet Take 10 mg by mouth daily.    [provider]  chlorhexidine (PERIDEX) 0.12 % solution  15 mLs 2 (two) times daily. 11/01/21   [provider]  cholecalciferol (VITAMIN D3) 25 MCG (1000 UNIT) tablet Take 2,000 Units by mouth daily.    [provider]  fluticasone (FLONASE) 50 MCG/ACT nasal spray Place 1 spray into both nostrils in the morning and at bedtime. 07/13/20   [provider]  gabapentin (NEURONTIN) 300 MG capsule Take 300 mg by mouth 2 (two) times daily as needed (pain). 09/15/20   [provider]  Ginkgo Biloba Extract 60 MG CAPS Take 1 capsule by mouth daily.    [provider]  montelukast (SINGULAIR) 10 MG tablet Take by mouth. 09/08/21   [provider]  pantoprazole (PROTONIX) 40 MG tablet Take 1 tablet (40 mg total) by mouth daily. 07/02/21   Long, Arlyss Repress, MD  valACYclovir (VALTREX) 500 MG tablet Take 500 mg by mouth daily.    [provider]    Family History Family History  Problem Relation Age of Onset   Cancer Mother        ovarian   Hypertension Mother    Diabetes Mother    Heart disease Mother    Hyperlipidemia Father    Hypertension Father    Cancer Maternal Aunt        breast  Breast cancer Paternal Aunt 66    Social History Social History   Tobacco Use   Smoking status: Former    Current packs/day: 0.00    Types: Cigarettes    Quit date: 06/06/2007    Years since quitting: 15.6    Passive exposure: Never   Smokeless tobacco: Never  Vaping Use   Vaping status: Never Used  Substance Use Topics   Alcohol use: Not Currently   Drug use: No     Allergies   Shellfish allergy   Review of Systems Review of Systems See HPI  Physical Exam Triage Vital Signs ED Triage Vitals  Encounter Vitals Group     BP 01/26/23 1447 125/77     Systolic BP Percentile --      Diastolic BP Percentile --      Pulse Rate 01/26/23 1447 89     Resp 01/26/23 1447 16     Temp 01/26/23 1447 (!) 97.5 F (36.4 C)     Temp src --      SpO2 01/26/23 1447 98 %     Weight --      Height --       Head Circumference --      Peak Flow --      Pain Score 01/26/23 1452 7     Pain Loc --      Pain Education --      Exclude from Growth Chart --    No data found.  Updated Vital Signs BP 125/77   Pulse 89   Temp (!) 97.5 F (36.4 C)   Resp 16   LMP 09/09/2015 Comment: Pt denies pregnancy-"menopausal symptoms" per pt  SpO2 98%      Physical Exam Constitutional:      General: She is not in acute distress.    Appearance: She is well-developed. She is ill-appearing.  HENT:     Head: Normocephalic and atraumatic.  Eyes:     Conjunctiva/sclera: Conjunctivae normal.     Pupils: Pupils are equal, round, and reactive to light.  Cardiovascular:     Rate and Rhythm: Normal rate.  Pulmonary:     Effort: Pulmonary effort is normal. No respiratory distress.  Abdominal:     General: There is no distension.     Palpations: Abdomen is soft.  Musculoskeletal:        General: Normal range of motion.     Cervical back: Normal range of motion.  Skin:    General: Skin is warm and dry.  Neurological:     Mental Status: She is alert.      UC Treatments / Results  Labs (all labs ordered are listed, but only abnormal results are displayed) Labs Reviewed  POCT INFLUENZA A/B - Abnormal; Notable for the following components:      Result Value   Influenza A, POC Positive (*)    All other components within normal limits  POC SARS CORONAVIRUS 2 AG -  ED    EKG   Radiology No results found.  Procedures Procedures (including critical care time)  Medications Ordered in UC Medications - No data to display  Initial Impression / Assessment and Plan / UC Course  I have reviewed the triage vital signs and the nursing notes.  Pertinent labs & imaging results that were available during my care of the patient were reviewed by me and considered in my medical decision making (see chart for details).     Discussed influenza.  Treatment.  Prevention.  Final Clinical Impressions(s) / UC  Diagnoses   Final diagnoses:  Influenza A     Discharge Instructions      Drink lots of water Take Tylenol or ibuprofen for pain and fever Take over-the-counter cough or cold medicines as needed Call for problems   ED Prescriptions     Medication Sig Dispense Auth. Provider   oseltamivir (TAMIFLU) 75 MG capsule Take 1 capsule (75 mg total) by mouth every 12 (twelve) hours. 10 capsule Eustace Moore, MD      PDMP not reviewed this encounter.   Eustace Moore, MD 01/26/23 [phone]

## 2023-02-14 ENCOUNTER — Other Ambulatory Visit: Payer: Self-pay | Admitting: Obstetrics and Gynecology

## 2023-02-14 DIAGNOSIS — Z1231 Encounter for screening mammogram for malignant neoplasm of breast: Secondary | ICD-10-CM

## 2023-04-10 ENCOUNTER — Ambulatory Visit: Payer: 59

## 2023-04-10 DIAGNOSIS — Z1231 Encounter for screening mammogram for malignant neoplasm of breast: Secondary | ICD-10-CM | POA: Diagnosis not present

## 2023-04-14 ENCOUNTER — Ambulatory Visit
Admission: EM | Admit: 2023-04-14 | Discharge: 2023-04-14 | Disposition: A | Discharge status: Home / Self Care | Attending: Family Medicine | Admitting: Family Medicine

## 2023-04-14 ENCOUNTER — Other Ambulatory Visit: Payer: Self-pay

## 2023-04-14 DIAGNOSIS — E785 Hyperlipidemia, unspecified: Secondary | ICD-10-CM | POA: Diagnosis not present

## 2023-04-14 DIAGNOSIS — L03032 Cellulitis of left toe: Secondary | ICD-10-CM | POA: Insufficient documentation

## 2023-04-14 DIAGNOSIS — Z8349 Family history of other endocrine, nutritional and metabolic diseases: Secondary | ICD-10-CM | POA: Insufficient documentation

## 2023-04-14 DIAGNOSIS — Z8249 Family history of ischemic heart disease and other diseases of the circulatory system: Secondary | ICD-10-CM | POA: Diagnosis not present

## 2023-04-14 DIAGNOSIS — I10 Essential (primary) hypertension: Secondary | ICD-10-CM | POA: Diagnosis not present

## 2023-04-14 DIAGNOSIS — N3000 Acute cystitis without hematuria: Secondary | ICD-10-CM | POA: Insufficient documentation

## 2023-04-14 DIAGNOSIS — E669 Obesity, unspecified: Secondary | ICD-10-CM | POA: Insufficient documentation

## 2023-04-14 DIAGNOSIS — M79676 Pain in unspecified toe(s): Secondary | ICD-10-CM | POA: Diagnosis present

## 2023-04-14 LAB — POCT URINALYSIS DIP (MANUAL ENTRY)
Bilirubin, UA: NEGATIVE
Blood, UA: NEGATIVE
Glucose, UA: NEGATIVE mg/dL
Ketones, POC UA: NEGATIVE mg/dL
Nitrite, UA: NEGATIVE
Protein Ur, POC: NEGATIVE mg/dL
Spec Grav, UA: 1.015 (ref 1.010–1.025)
Urobilinogen, UA: NEGATIVE U/dL — AB
pH, UA: 5.5 (ref 5.0–8.0)

## 2023-04-14 MED ORDER — CEPHALEXIN 500 MG PO CAPS: 500.0000 mg | ORAL_CAPSULE | Freq: Four times a day (QID) | ORAL | 0 refills | Status: AC

## 2023-04-14 NOTE — Discharge Instructions (Addendum)
 Advised patient to take medication as directed with food to completion.  Encouraged increase daily water intake to 64 ounces per day while taking this medication.  Advised we will follow-up with urine culture results once received.  Advised if symptoms worsen and/or unresolved please follow-up with your PCP or here for further evaluation.

## 2023-04-14 NOTE — ED Provider Notes (Signed)
 [facility]    CSN: [id number] Arrival date & time: 04/14/23  1025      History   Chief Complaint Chief Complaint  Patient presents with   Toe Pain   Dysuria    HPI Jessica Glass is a 58 y.o. female.   HPI very pleasant 58 year old patient reports with infected left great toenail and possible UTI.  PMH significant for obesity, HTN, and HLD.  Past Medical History:  Diagnosis Date   Arrhythmia 05/26/09   ECHO ALL CHAMBERS  ARE NORMAL IN SIZE AND FUNCTION EF70%   Cervical spondylosis 12/20/2017   GERD (gastroesophageal reflux disease)    Hyperlipidemia    Hypertension    Obstructive sleep apnea    currently wearing CPAP   Seasonal allergies     Patient Active Problem List   Diagnosis Date Noted   Acute pyelonephritis 10/01/2020   Sepsis (HCC) 10/01/2020   UTI (urinary tract infection) 10/01/2020   Hypokalemia 10/01/2020   Chest pain 01/02/2018   Obstructive sleep apnea 04/10/2013   Hypertension    Hyperlipidemia    Backache 02/28/2007   GERD 08/21/2006   Essential hypertension 08/15/2006   Allergic rhinitis 08/15/2006    Past Surgical History:  Procedure Laterality Date   ADENOIDECTOMY     BLADDER SUSPENSION     DEBRIDEMENT TENNIS ELBOW     ELBOW SURGERY     bilateral tendon surgery   PATELLA RECONSTRUCTION Left    TONSILLECTOMY      OB History   No obstetric history on file.      Home Medications    Prior to Admission medications   Medication Sig Start Date End Date Taking? Authorizing Provider  benazepril-hydrochlorthiazide (LOTENSIN HCT) 20-12.5 MG tablet Take 1 tablet by mouth daily. 12/18/21 05/05/23 Yes [provider]  cephALEXin (KEFLEX) 500 MG capsule Take 1 capsule (500 mg total) by mouth 4 (four) times daily for 7 days. 04/14/23 04/21/23 Yes Trevor Iha, FNP  atorvastatin (LIPITOR) 20 MG tablet Take 20 mg by mouth daily.    [provider]  Azelastine HCl 137 MCG/SPRAY SOLN Place 1 spray into both nostrils  in the morning and at bedtime. 07/13/20   [provider]  cetirizine (ZYRTEC) 10 MG tablet Take 10 mg by mouth daily.    [provider]  chlorhexidine (PERIDEX) 0.12 % solution 15 mLs 2 (two) times daily. 11/01/21   [provider]  cholecalciferol (VITAMIN D3) 25 MCG (1000 UNIT) tablet Take 2,000 Units by mouth daily.    [provider]  fluticasone (FLONASE) 50 MCG/ACT nasal spray Place 1 spray into both nostrils in the morning and at bedtime. 07/13/20   [provider]  gabapentin (NEURONTIN) 300 MG capsule Take 300 mg by mouth 2 (two) times daily as needed (pain). 09/15/20   [provider]  Ginkgo Biloba Extract 60 MG CAPS Take 1 capsule by mouth daily.    [provider]  montelukast (SINGULAIR) 10 MG tablet Take by mouth. 09/08/21   [provider]  oseltamivir (TAMIFLU) 75 MG capsule Take 1 capsule (75 mg total) by mouth every 12 (twelve) hours. Patient not taking: Reported on 04/14/2023 01/26/23   Eustace Moore, MD  pantoprazole (PROTONIX) 40 MG tablet Take 1 tablet (40 mg total) by mouth daily. 07/02/21   Long, Arlyss Repress, MD  valACYclovir (VALTREX) 500 MG tablet Take 500 mg by mouth daily.    [provider]    Family History Family History  Problem  Relation Age of Onset   Cancer Mother        ovarian   Hypertension Mother    Diabetes Mother    Heart disease Mother    Hyperlipidemia Father    Hypertension Father    Cancer Maternal Aunt        breast   Breast cancer Paternal Aunt 89    Social History Social History   Tobacco Use   Smoking status: Former    Current packs/day: 0.00    Types: Cigarettes    Quit date: 06/06/2007    Years since quitting: 15.8    Passive exposure: Never   Smokeless tobacco: Never  Vaping Use   Vaping status: Never Used  Substance Use Topics   Alcohol use: Not Currently   Drug use: No     Allergies   Shellfish allergy   Review of Systems Review of Systems   Genitourinary:  Positive for dysuria.  Skin:  Positive for rash.  All other systems reviewed and are negative.    Physical Exam Triage Vital Signs ED Triage Vitals  Encounter Vitals Group     BP      Systolic BP Percentile      Diastolic BP Percentile      Pulse      Resp      Temp      Temp src      SpO2      Weight      Height      Head Circumference      Peak Flow      Pain Score      Pain Loc      Pain Education      Exclude from Growth Chart    No data found.  Updated Vital Signs BP (!) 138/92   Pulse 85   Temp 98 F (36.7 C)   Resp 16   LMP 09/09/2015 Comment: Pt denies pregnancy-"menopausal symptoms" per pt  SpO2 98%    Physical Exam Vitals and nursing note reviewed.  Constitutional:      Appearance: Normal appearance. She is normal weight.  HENT:     Head: Normocephalic and atraumatic.     Mouth/Throat:     Mouth: Mucous membranes are moist.     Pharynx: Oropharynx is clear.  Eyes:     Extraocular Movements: Extraocular movements intact.     Conjunctiva/sclera: Conjunctivae normal.     Pupils: Pupils are equal, round, and reactive to light.  Cardiovascular:     Rate and Rhythm: Normal rate and regular rhythm.     Pulses: Normal pulses.     Heart sounds: Normal heart sounds.  Pulmonary:     Effort: Pulmonary effort is normal.     Breath sounds: Normal breath sounds. No wheezing, rhonchi or rales.  Abdominal:     Tenderness: There is no right CVA tenderness or left CVA tenderness.  Musculoskeletal:        General: Normal range of motion.     Cervical back: Normal range of motion and neck supple.  Skin:    General: Skin is warm and dry.     Comments: Left great toe (medial nail plate border): Erythematous mildly indurated please see image below  Neurological:     General: No focal deficit present.     Mental Status: She is alert and oriented to person, place, and time. Mental status is at baseline.  Psychiatric:        Mood and Affect:  Mood normal.        Behavior: Behavior normal.      UC Treatments / Results  Labs (all labs ordered are listed, but only abnormal results are displayed) Labs Reviewed  POCT URINALYSIS DIP (MANUAL ENTRY) - Abnormal; Notable for the following components:      Result Value   Urobilinogen, UA negative (*)    Leukocytes, UA Large (3+) (*)    All other components within normal limits  URINE CULTURE    EKG   Radiology No results found.  Procedures Procedures (including critical care time)  Medications Ordered in UC Medications - No data to display  Initial Impression / Assessment and Plan / UC Course  I have reviewed the triage vital signs and the nursing notes.  Pertinent labs & imaging results that were available during my care of the patient were reviewed by me and considered in my medical decision making (see chart for details).     MDM: 1.  Acute cystitis with out hematuria-UA revealed above, urine culture ordered, Rx'd Keflex 500 mg capsule: Take 2 capsules twice daily x 7 days; 2.  Paronychia of great toe of left foot-Rx'd Keflex 500 mg capsule: Take 2 capsules twice daily x 7 days. Advised patient to take medication as directed with food to completion.  Encouraged increase daily water intake to 64 ounces per day while taking this medication.  Advised we will follow-up with urine culture results once received.  Advised if symptoms worsen and/or unresolved please follow-up with your PCP or here for further evaluation.  Patient discharged home, hemodynamically stable. Final Clinical Impressions(s) / UC Diagnoses   Final diagnoses:  Acute cystitis without hematuria  Paronychia of great toe of left foot     Discharge Instructions      Advised patient to take medication as directed with food to completion.  Encouraged increase daily water intake to 64 ounces per day while taking this medication.  Advised we will follow-up with urine culture results once received.  Advised  if symptoms worsen and/or unresolved please follow-up with your PCP or here for further evaluation.     ED Prescriptions     Medication Sig Dispense Auth. Provider   cephALEXin (KEFLEX) 500 MG capsule Take 1 capsule (500 mg total) by mouth 4 (four) times daily for 7 days. 28 capsule Trevor Iha, FNP      PDMP not reviewed this encounter.   Trevor Iha, FNP 04/14/23 1155

## 2023-04-14 NOTE — ED Triage Notes (Signed)
 Pt presents to uc with co of left big toe nail infection for 1 week with purulent drainage. Pt also reports dysuria for 3 days and is concerned she may be developing a urti

## 2023-04-17 LAB — URINE CULTURE: Culture: 30000 — AB

## 2023-04-22 ENCOUNTER — Telehealth (HOSPITAL_COMMUNITY): Payer: Self-pay

## 2023-04-22 NOTE — Telephone Encounter (Signed)
 Pt called stating she has finished abx from recent UC visit. She reports UTI symptoms have mostly improved, but not resolved. She also reports her toe is no better.  Advised pt to return to [facility] for further evaluation. Pt states she is not able to until this weekend due to her work schedule. Pt asked if any medication could be added this week. Advised pt would ask provider. Informed pt, will only call if new meds are advised. Verbalized understanding.

## 2023-05-06 ENCOUNTER — Encounter: Payer: Self-pay | Admitting: Podiatry

## 2023-05-06 ENCOUNTER — Telehealth: Payer: Self-pay | Admitting: Podiatry

## 2023-05-06 ENCOUNTER — Ambulatory Visit (INDEPENDENT_AMBULATORY_CARE_PROVIDER_SITE_OTHER)

## 2023-05-06 ENCOUNTER — Ambulatory Visit (INDEPENDENT_AMBULATORY_CARE_PROVIDER_SITE_OTHER): Admitting: Podiatry

## 2023-05-06 DIAGNOSIS — M2041 Other hammer toe(s) (acquired), right foot: Secondary | ICD-10-CM | POA: Diagnosis not present

## 2023-05-06 DIAGNOSIS — M2042 Other hammer toe(s) (acquired), left foot: Secondary | ICD-10-CM | POA: Diagnosis not present

## 2023-05-06 DIAGNOSIS — L600 Ingrowing nail: Secondary | ICD-10-CM | POA: Diagnosis not present

## 2023-05-06 NOTE — Patient Instructions (Signed)

## 2023-05-06 NOTE — Telephone Encounter (Signed)
 RECEIVED SURGICAL CONSENT. LEFT MESSAGE FOR PT TO CALL TO SCHEDULE HER SURGERY

## 2023-05-06 NOTE — Progress Notes (Signed)
 Chief Complaint  Patient presents with   Hammer Toe    "I want to have my right foot worked on now, [employer]    "I  want to have my callus scraped on the left foot.     Ingrown Toenail    "I have an ingrown toenail." N - ingrown toenail  L - bilateral hallux D - 3 weeks O - suddenly, gotten worse; left > right C - had pus, throbs very sore  A - socks T - Urgent Care - gave me an antibiotic - Cephalexin  for 7 days, twice a day    Subjective: Patient presents today for evaluation of pain to the medial and lateral border of the bilateral great toes. Patient is concerned for possible ingrown nail.  It is very sensitive to touch.  Patient presents today for further treatment and evaluation.  Patient also has a history of hammertoe repair to the digits 2-5 of the left foot.  She is very satisfied with the surgical outcome.  She says that she has been experiencing significant pain and tenderness to the right foot hammertoes now for several years despite conservative treatment.  She states that she is ready to proceed with surgery to correct for the symptomatic hammertoes now to the right foot  Past Medical History:  Diagnosis Date   Arrhythmia 05/26/09   ECHO ALL CHAMBERS  ARE NORMAL IN SIZE AND FUNCTION EF70%   Cervical spondylosis 12/20/2017   GERD (gastroesophageal reflux disease)    Hyperlipidemia    Hypertension    Obstructive sleep apnea    currently wearing CPAP   Seasonal allergies     Past Surgical History:  Procedure Laterality Date   ADENOIDECTOMY     BLADDER SUSPENSION     DEBRIDEMENT TENNIS ELBOW     ELBOW SURGERY     bilateral tendon surgery   PATELLA RECONSTRUCTION Left    TONSILLECTOMY      Allergies  Allergen Reactions   Shellfish Allergy Anaphylaxis    Objective:  General: Well developed, nourished, in no acute distress, alert and oriented x3   Dermatology: Skin is warm, dry and supple bilateral.  Medial lateral border of the  bilateral great toes is tender with evidence of an ingrowing nail. Pain on palpation noted to the border of the nail fold. The remaining nails appear unremarkable at this time.   Vascular: DP and PT pulses palpable.  No clinical evidence of vascular compromise  Neruologic: Grossly intact via light touch bilateral.  Musculoskeletal: PSxHx hammertoe repair digits 2-5 of the left foot with routine healing.  Symptomatic painful hammertoes noted to the 2nd, 3rd and 4th digit of the right foot.  Radiographic exam RT foot 05/06/2023: Hammertoe contracture noted to the lesser digits of the right foot.  Assesement: #1 Paronychia with ingrowing nail medial and lateral border of the bilateral great toes #2 symptomatic hammertoes digits 2, 3, 4 right foot #3 PSxHx Hammertoe repair 2-5 LT. DOS: 11/23/2021  Plan of Care:  -Patient evaluated.  -Discussed treatment alternatives and plan of care. Explained nail avulsion procedure and post procedure course to patient. -Patient opted for permanent partial nail avulsion of the ingrown portion of the nail.  -Prior to procedure, local anesthesia infiltration utilized using 3 ml of a 50:50 mixture of 2% plain lidocaine and 0.5% plain marcaine in a normal hallux block fashion and a betadine prep performed.  -Partial permanent nail avulsion with chemical matrixectomy performed using 3x30sec applications of phenol  followed by alcohol flush.  -Light dressing applied.  Post care instructions provided -Return to clinic 3 weeks  -In regards to the hammertoes, x-rays were reviewed today -Today we discussed in detail hammertoe repair which would essentially be the same surgical approach as the left foot.  Risk benefits advantages and disadvantages as well as the postoperative recovery course were explained in detail to the patient.  No guarantees were expressed or implied.  All patient questions answered.  The patient would like to proceed with correction of the hammertoes  to the right foot -Authorization for surgery was initiated today.  Surgery will consist of hammertoe arthroplasty digits 2, 3, 4 of the right foot.  Weil shortening osteotomy second metatarsal right foot -Return to clinic 1 week postop  Dot Gazella, DPM [facility]  Dr. Dot Gazella, DPM    [address] [place], [place] [address]                Office [phone]  Fax [phone]

## 2023-05-17 ENCOUNTER — Other Ambulatory Visit: Payer: Self-pay | Admitting: Podiatry

## 2023-05-17 ENCOUNTER — Telehealth: Payer: Self-pay | Admitting: Podiatry

## 2023-05-17 MED ORDER — MUPIROCIN 2 % EX OINT: 1.0000 | TOPICAL_OINTMENT | Freq: Two times a day (BID) | CUTANEOUS | 1 refills | Status: DC

## 2023-05-17 MED ORDER — DOXYCYCLINE HYCLATE 100 MG PO TABS: 100.0000 mg | ORAL_TABLET | Freq: Two times a day (BID) | ORAL | 0 refills | Status: DC

## 2023-05-17 NOTE — Telephone Encounter (Signed)
 Patient is requesting medication for foot until she see provider. Left foot great toe is infected and oozing. Patient was asked if we can schedule in sooner, Patient stated she does not want to come in, due to schedule appointment coming up on 05/27/23. Patient contact telephone number, [phone]

## 2023-05-17 NOTE — Progress Notes (Signed)
 PRN s/p ingrown toenail procedure  Dot Gazella, DPM [facility]  Dr. Dot Gazella, DPM    [address] [place], [place] [address]                Office [phone]  Fax [phone]

## 2023-05-27 ENCOUNTER — Ambulatory Visit (INDEPENDENT_AMBULATORY_CARE_PROVIDER_SITE_OTHER): Admitting: Podiatry

## 2023-05-27 ENCOUNTER — Encounter: Payer: Self-pay | Admitting: Podiatry

## 2023-05-27 ENCOUNTER — Telehealth: Payer: Self-pay | Admitting: Podiatry

## 2023-05-27 DIAGNOSIS — L600 Ingrowing nail: Secondary | ICD-10-CM | POA: Diagnosis not present

## 2023-05-27 NOTE — Progress Notes (Signed)
   Chief Complaint  Patient presents with   Ingrown Toenail    F/u ingrown nails bilateral great toes. 0 pain. Non diabetic.    Subjective: 58 y.o. female presents today status post permanent nail avulsion procedure of the medial and lateral border of the left great toe that was performed on 05/06/2023.  Patient doing well.   Past Medical History:  Diagnosis Date   Arrhythmia 05/26/09   ECHO ALL CHAMBERS  ARE NORMAL IN SIZE AND FUNCTION EF70%   Cervical spondylosis 12/20/2017   GERD (gastroesophageal reflux disease)    Hyperlipidemia    Hypertension    Obstructive sleep apnea    currently wearing CPAP   Seasonal allergies     Objective: Neurovascular status intact.  Skin is warm, dry and supple. Nail and respective nail fold appears to be healing appropriately.   Assessment: #1  Status post partial permanent nail matricectomy medial lateral border bilateral great toes.  04/28/2023  #2 symptomatic hammertoes digits 2, 3, 4 right foot #3 PSxHx Hammertoe repair 2-5 LT. DOS: 11/23/2021     Plan of care: -Patient was evaluated  -Light debridement of the periungual debris was performed to the border of the respective toe and nail plate using a tissue nipper. -Patient is to return to clinic on a PRN basis.  -Authorization for hammertoe corrective surgery was initiated last visit on 05/06/2023.  Surgery will consist of hammertoe arthroplasty digits 2, 3, 4 of the right foot.  Weil shortening osteotomy second metatarsal right foot -Patient is anticipating surgery for mid July -Return to clinic 1 week postop  Dot Gazella, DPM [facility]  Dr. Dot Gazella, DPM    [address] [place], [place] [address]                Office [phone]  Fax [phone]

## 2023-05-27 NOTE — Telephone Encounter (Signed)
 Pt called back and is scheduled for surgery on 07/25/23. [facility] Hash is primary pharmacy and is 1st in chart.

## 2023-06-06 ENCOUNTER — Telehealth: Payer: Self-pay | Admitting: Podiatry

## 2023-06-06 NOTE — Telephone Encounter (Signed)
 DOS: 07/25/23  (RT) METATARSAL OSTEOTOMY [id number] (RT) 2-4 HAMMER TOE [id number]     EFFECTIVE DATE :  01/09/23  DEDUCTIBLE :  $1,300.00   REMAINING:   $1,152.06  OOP:   $3,200.00  REMAINING:  $2,940.57   COINSURANCE:  20%    PER [employer] PROVIDER PORTAL CPT CODES [id number],62952 ARE APPROVED   WUX#[id number]

## 2023-07-10 ENCOUNTER — Telehealth: Payer: Self-pay | Admitting: Podiatry

## 2023-07-10 NOTE — Telephone Encounter (Signed)
 lft mes on vmail for pt to call me about forms from her employer. I eft my fax# as well to have forms faxed to me. DOS 07/25/23. I need to know her last day at work and adv approx RTW 09/10/23??

## 2023-07-11 ENCOUNTER — Telehealth: Payer: Self-pay | Admitting: Podiatry

## 2023-07-11 DIAGNOSIS — Z0271 Encounter for disability determination: Secondary | ICD-10-CM

## 2023-07-11 NOTE — Telephone Encounter (Signed)
 S/w pt and adv [employer] forms were recd and wil fax to [phone] and email her copy for her records RTW 09/10/23- last day of work 07/24/23 DOS 07/25/23 and if need longer or want to go back sooner can revise

## 2023-07-14 ENCOUNTER — Encounter: Payer: Self-pay | Admitting: Podiatry

## 2023-07-15 NOTE — Telephone Encounter (Signed)
 Pt sent mess via MyChart on extension of RTW to 11/08/23. I adv her would send revision to her and [employer].

## 2023-07-25 ENCOUNTER — Other Ambulatory Visit: Payer: Self-pay | Admitting: Podiatry

## 2023-07-25 DIAGNOSIS — M2041 Other hammer toe(s) (acquired), right foot: Secondary | ICD-10-CM | POA: Diagnosis not present

## 2023-07-25 DIAGNOSIS — M21541 Acquired clubfoot, right foot: Secondary | ICD-10-CM | POA: Diagnosis not present

## 2023-07-25 MED ORDER — IBUPROFEN 800 MG PO TABS: 800.0000 mg | ORAL_TABLET | Freq: Three times a day (TID) | ORAL | 1 refills | Status: AC

## 2023-07-25 MED ORDER — OXYCODONE-ACETAMINOPHEN 5-325 MG PO TABS: 1.0000 | ORAL_TABLET | ORAL | 0 refills | Status: DC | PRN

## 2023-07-25 NOTE — Progress Notes (Signed)
 PRN postop

## 2023-07-25 NOTE — Telephone Encounter (Signed)
 [employer] sent forms again. I refaxed them  [phone]  with the revised RTW 11/08/23.

## 2023-07-26 ENCOUNTER — Telehealth: Payer: Self-pay | Admitting: Podiatry

## 2023-07-26 NOTE — Telephone Encounter (Signed)
 Patients boot is loose. Heel isn't all the way to the back of boot. Is it okay to tighten it? If so how much should it be tightened?

## 2023-07-26 NOTE — Telephone Encounter (Signed)
 Spoke to patient and advised her to readjust the boot making sure her heel is back in the boot and can adjust to her comfort level.

## 2023-07-31 ENCOUNTER — Ambulatory Visit (INDEPENDENT_AMBULATORY_CARE_PROVIDER_SITE_OTHER): Admitting: Podiatry

## 2023-07-31 ENCOUNTER — Telehealth: Payer: Self-pay | Admitting: Podiatry

## 2023-07-31 DIAGNOSIS — M2041 Other hammer toe(s) (acquired), right foot: Secondary | ICD-10-CM

## 2023-07-31 NOTE — Progress Notes (Signed)
   Chief Complaint  Patient presents with   Post-op Follow-up    DOS 07/25/2023. Reports 10/10 pain level last night and 4-5/10 this morning. Dressing was saturated with old blood. Staples and surgical site intact. Pins in place in 2nd through 4th toes.     Subjective:  Patient presents today status post hammertoe correction digits 2, 3, 4 of the right foot as well as Weil shortening osteotomy to the second metatarsal of the right foot.  Patient doing very well.  Pain tolerable.  Currently in a cam boot using crutches NWB  Past Medical History:  Diagnosis Date   Arrhythmia 05/26/09   ECHO ALL CHAMBERS  ARE NORMAL IN SIZE AND FUNCTION EF70%   Cervical spondylosis 12/20/2017   GERD (gastroesophageal reflux disease)    Hyperlipidemia    Hypertension    Obstructive sleep apnea    currently wearing CPAP   Seasonal allergies     Past Surgical History:  Procedure Laterality Date   ADENOIDECTOMY     BLADDER SUSPENSION     DEBRIDEMENT TENNIS ELBOW     ELBOW SURGERY     bilateral tendon surgery   PATELLA RECONSTRUCTION Left    TONSILLECTOMY      Allergies  Allergen Reactions   Shellfish Allergy Anaphylaxis    Objective/Physical Exam Neurovascular status intact.  Incision well coapted with staples intact. No sign of infectious process noted. No dehiscence. No active bleeding noted.  Toes are in nice rectus alignment.  Capillary refill WNL.  Minimal edema..  Assessment: 1. s/p hammertoe repair digits 2, 3, 4 right foot. DOS: 07/25/2023   Plan of Care:  -Patient was evaluated.  -Dressings changed.  Leave clean dry and intact x 1 week -Continue NWB CAM boot.  Patient is requesting a knee scooter to continue nonweightbearing.  Order placed -Return to clinic 1 week possible staple removal.  She will need postop x-rays  Thresa EMERSON Sar, DPM [facility]  Dr. Thresa EMERSON Sar, DPM    [address] [place], [place] [address]                 Office [phone]  Fax [phone]

## 2023-07-31 NOTE — Telephone Encounter (Signed)
 Pt called and said pt that Dr Janit was to be ordering a knee scooter for her at her visit today. He mentioned my name.   I have ordered it thru [employer].

## 2023-08-01 ENCOUNTER — Telehealth: Payer: Self-pay | Admitting: Podiatry

## 2023-08-01 NOTE — Telephone Encounter (Signed)
 Received this message from [employer] ...upon checking the account i see the pts account is on hold i have messaged details to the patient, once they resolve the past due balances this order can be resubmitted. i am cancelling at this time.

## 2023-08-07 ENCOUNTER — Encounter: Payer: Self-pay | Admitting: Podiatry

## 2023-08-07 ENCOUNTER — Ambulatory Visit (INDEPENDENT_AMBULATORY_CARE_PROVIDER_SITE_OTHER): Admitting: Podiatry

## 2023-08-07 ENCOUNTER — Ambulatory Visit (INDEPENDENT_AMBULATORY_CARE_PROVIDER_SITE_OTHER)

## 2023-08-07 VITALS — Ht 65.0 in | Wt 210.0 lb

## 2023-08-07 DIAGNOSIS — M2041 Other hammer toe(s) (acquired), right foot: Secondary | ICD-10-CM | POA: Diagnosis not present

## 2023-08-07 MED ORDER — OXYCODONE-ACETAMINOPHEN 5-325 MG PO TABS: 1.0000 | ORAL_TABLET | Freq: Four times a day (QID) | ORAL | 0 refills | Status: DC | PRN

## 2023-08-07 NOTE — Progress Notes (Signed)
   Chief Complaint  Patient presents with   Routine Post Op    hammertoe repair digits 2, 3, 4 right foot. DOS: 07/25/2023, pt states she is still having some pain, slight drainage from the incision, she continues to use cam boot and crutches to ambulate.       Subjective:  Patient presents today status post hammertoe correction digits 2, 3, 4 of the right foot as well as Weil shortening osteotomy to the second metatarsal of the right foot.  Patient doing very well.  Pain tolerable.  Currently in a cam boot using crutches NWB  Past Medical History:  Diagnosis Date   Arrhythmia 05/26/09   ECHO ALL CHAMBERS  ARE NORMAL IN SIZE AND FUNCTION EF70%   Cervical spondylosis 12/20/2017   GERD (gastroesophageal reflux disease)    Hyperlipidemia    Hypertension    Obstructive sleep apnea    currently wearing CPAP   Seasonal allergies     Past Surgical History:  Procedure Laterality Date   ADENOIDECTOMY     BLADDER SUSPENSION     DEBRIDEMENT TENNIS ELBOW     ELBOW SURGERY     bilateral tendon surgery   PATELLA RECONSTRUCTION Left    TONSILLECTOMY      Allergies  Allergen Reactions   Shellfish Allergy Anaphylaxis    Objective/Physical Exam Unchanged.  Neurovascular status intact.  Incision well coapted with staples intact. No sign of infectious process noted. No dehiscence. No active bleeding noted.  Toes are in nice rectus alignment.  Capillary refill WNL.  Minimal edema..  Radiographic exam RT foot 08/07/2023 Hardware and percutaneous pins intact.  Good rectus alignment of the toes.  No complicating feature.  Overall well-healing surgical sites radiographically  Assessment: 1. s/p hammertoe repair digits 2, 3, 4 right foot. DOS: 07/25/2023   Plan of Care:  -Patient was evaluated.  X-rays reviewed -Dressings changed.  Leave clean dry and intact through the weekend.  After the weekend she may remove the dressings and begin washing and showering getting the foot wet -Recommend  triple antibiotic over the percutaneous pins at the distal tip of the toe with a clean cotton sock -Continue WBAT CAM boot -Refill prescription for Percocet 5/325 mg Q8H as needed pain -Return to clinic 2 weeks percutaneous pin removal  Thresa EMERSON Sar, DPM [facility]  Dr. Thresa EMERSON Sar, DPM    [address] [place], [place] [address]                Office [phone]  Fax [phone]     Left

## 2023-08-21 ENCOUNTER — Ambulatory Visit (INDEPENDENT_AMBULATORY_CARE_PROVIDER_SITE_OTHER)

## 2023-08-21 ENCOUNTER — Ambulatory Visit (INDEPENDENT_AMBULATORY_CARE_PROVIDER_SITE_OTHER): Admitting: Podiatry

## 2023-08-21 ENCOUNTER — Encounter: Payer: Self-pay | Admitting: Podiatry

## 2023-08-21 DIAGNOSIS — M2041 Other hammer toe(s) (acquired), right foot: Secondary | ICD-10-CM

## 2023-08-21 NOTE — Addendum Note (Signed)
 Addended by: NICHOLAUS MARJORIE SAILOR on: 08/21/2023 11:07 AM   Modules accepted: Orders

## 2023-08-21 NOTE — Progress Notes (Signed)
   Chief Complaint  Patient presents with   Routine Post Op    POV # 3 DOS 07/25/23 -RT 2- 4 HAMMERTOE REPAIR, RT 2ND MET WEIL SHORTENING OSTEOTOMY, pt states she is still having pain in the foot as well as a burning sensation, continues to wear cam boot and knee scooter to ambulate.    Subjective:  Patient presents today status post hammertoe correction digits 2, 3, 4 of the right foot as well as Weil shortening osteotomy to the second metatarsal of the right foot.  Patient doing very well.  Pain tolerable.  Currently in a cam boot using crutches NWB  Past Medical History:  Diagnosis Date   Arrhythmia 05/26/09   ECHO ALL CHAMBERS  ARE NORMAL IN SIZE AND FUNCTION EF70%   Cervical spondylosis 12/20/2017   GERD (gastroesophageal reflux disease)    Hyperlipidemia    Hypertension    Obstructive sleep apnea    currently wearing CPAP   Seasonal allergies     Past Surgical History:  Procedure Laterality Date   ADENOIDECTOMY     BLADDER SUSPENSION     DEBRIDEMENT TENNIS ELBOW     ELBOW SURGERY     bilateral tendon surgery   PATELLA RECONSTRUCTION Left    TONSILLECTOMY      Allergies  Allergen Reactions   Shellfish Allergy Anaphylaxis    Objective/Physical Exam Unchanged.  Neurovascular status intact.  Incision well coapted with staples intact. No sign of infectious process noted. No dehiscence. No active bleeding noted.  Toes are in nice rectus alignment.  Capillary refill WNL.  Minimal edema..  Radiographic exam RT foot 08/21/2023 Stable.  Percutaneous pins removed.  Good rectus alignment of the toes.  No complicating feature.  Overall well-healing surgical sites radiographically  Assessment: 1. s/p hammertoe repair digits 2, 3, 4 right foot. DOS: 07/25/2023   Plan of Care:  -Patient was evaluated.  X-rays reviewed - Percutaneous fixation pins removed -WBAT CAM boot -Over the next month the patient may slowly transition out of the cam boot into good supportive tennis shoes  as she feels comfortable -Return to clinic 4 weeks follow-up x-ray  Thresa EMERSON Sar, DPM [facility]  Dr. Thresa EMERSON Sar, DPM    [address] [place], [place] [address]                Office [phone]  Fax [phone]     Left

## 2023-09-23 ENCOUNTER — Ambulatory Visit (INDEPENDENT_AMBULATORY_CARE_PROVIDER_SITE_OTHER): Admitting: Podiatry

## 2023-09-23 ENCOUNTER — Encounter: Payer: Self-pay | Admitting: Podiatry

## 2023-09-23 ENCOUNTER — Ambulatory Visit (INDEPENDENT_AMBULATORY_CARE_PROVIDER_SITE_OTHER)

## 2023-09-23 DIAGNOSIS — M2041 Other hammer toe(s) (acquired), right foot: Secondary | ICD-10-CM

## 2023-09-23 NOTE — Progress Notes (Unsigned)
   Chief Complaint  Patient presents with   Routine Post Op    PT stated that she has had some pain in right foot, PT also stated that she is taking tylenol  for pain. No other complaints     Subjective:  Patient presents today status post hammertoe correction digits 2, 3, 4 of the right foot as well as Weil shortening osteotomy to the second metatarsal of the right foot.  Patient doing very well.  Pain tolerable.  Currently in a cam boot using crutches NWB  Past Medical History:  Diagnosis Date   Arrhythmia 05/26/09   ECHO ALL CHAMBERS  ARE NORMAL IN SIZE AND FUNCTION EF70%   Cervical spondylosis 12/20/2017   GERD (gastroesophageal reflux disease)    Hyperlipidemia    Hypertension    Obstructive sleep apnea    currently wearing CPAP   Seasonal allergies     Past Surgical History:  Procedure Laterality Date   ADENOIDECTOMY     BLADDER SUSPENSION     DEBRIDEMENT TENNIS ELBOW     ELBOW SURGERY     bilateral tendon surgery   PATELLA RECONSTRUCTION Left    TONSILLECTOMY      Allergies  Allergen Reactions   Shellfish Allergy Anaphylaxis    Objective/Physical Exam Unchanged.  Neurovascular status intact.  Incision well coapted with staples intact. No sign of infectious process noted. No dehiscence. No active bleeding noted.  Toes are in nice rectus alignment.  Capillary refill WNL.  Minimal edema..  Radiographic exam RT foot 08/21/2023 Stable.  Percutaneous pins removed.  Good rectus alignment of the toes.  No complicating feature.  Overall well-healing surgical sites radiographically  Assessment: 1. s/p hammertoe repair digits 2, 3, 4 right foot. DOS: 07/25/2023   Plan of Care:  -Patient was evaluated.  X-rays reviewed - Percutaneous fixation pins removed -WBAT CAM boot -Over the next month the patient may slowly transition out of the cam boot into good supportive tennis shoes as she feels comfortable -Return to clinic 4 weeks follow-up x-ray  Thresa EMERSON Sar, DPM [facility]  Dr. Thresa EMERSON Sar, DPM    [address] [place], [place] [address]                Office [phone]  Fax [phone]     Left

## 2023-09-24 ENCOUNTER — Other Ambulatory Visit: Payer: Self-pay | Admitting: Podiatry

## 2023-09-24 ENCOUNTER — Encounter: Payer: Self-pay | Admitting: Podiatry

## 2023-09-24 MED ORDER — OXYCODONE-ACETAMINOPHEN 5-325 MG PO TABS: 1.0000 | ORAL_TABLET | Freq: Three times a day (TID) | ORAL | 0 refills | Status: AC | PRN

## 2023-09-25 NOTE — Telephone Encounter (Signed)
 per mess from Dr Janit pt checking RTW and had 10/23/23. We have her out till 11/08/23. I called and lft mess for her to call with and date she wants to go bk 10/23/23 or 11/08/23.

## 2023-10-21 ENCOUNTER — Ambulatory Visit

## 2023-10-21 ENCOUNTER — Ambulatory Visit (INDEPENDENT_AMBULATORY_CARE_PROVIDER_SITE_OTHER): Admitting: Podiatry

## 2023-10-21 VITALS — Ht 65.0 in | Wt 210.0 lb

## 2023-10-21 DIAGNOSIS — M2041 Other hammer toe(s) (acquired), right foot: Secondary | ICD-10-CM

## 2023-10-21 NOTE — Progress Notes (Signed)
   Chief Complaint  Patient presents with   Post-op Follow-up    RM 9 Patient is here for post-op visit for right foot hammertoe.    Subjective:  Patient presents today status post hammertoe correction digits 2, 3, 4 of the right foot as well as Weil shortening osteotomy to the second metatarsal of the right foot.  Patient has been trying to get back to normal activity and wearing closed toed shoes but she continues to have some rubbing to the toes and discomfort especially when she tries to wear her steel toed boots that she needs to wear at work  Past Medical History:  Diagnosis Date   Arrhythmia 05/26/09   ECHO ALL CHAMBERS  ARE NORMAL IN SIZE AND FUNCTION EF70%   Cervical spondylosis 12/20/2017   GERD (gastroesophageal reflux disease)    Hyperlipidemia    Hypertension    Obstructive sleep apnea    currently wearing CPAP   Seasonal allergies     Past Surgical History:  Procedure Laterality Date   ADENOIDECTOMY     BLADDER SUSPENSION     DEBRIDEMENT TENNIS ELBOW     ELBOW SURGERY     bilateral tendon surgery   PATELLA RECONSTRUCTION Left    TONSILLECTOMY      Allergies  Allergen Reactions   Shellfish Allergy Anaphylaxis    Objective/Physical Exam Unchanged.  Neurovascular status intact.  Incisions are nicely healed.  Toes are in good rectus alignment.  Good range of motion to the lesser MTPs.  There continues to be some residual edema to the digits which should resolve over time  Radiographic exam RT foot 10/21/2023  Stable with routine healing noted.  Overall the toes are in good alignment.  Assessment: 1. s/p hammertoe repair digits 2, 3, 4 right foot. DOS: 07/25/2023   Plan of Care:  -Patient was evaluated.  X-rays reviewed - Continue to wear good supportive tennis shoes and sneakers.  Refrain from going barefoot. -Patient is having some tenderness and sensitivity to the toes with close toed shoes especially her work boots.  We will allow her foot to continue to  heal prior to returning to work.  Recommend extending RTW date to 12/03/2022 -Return to clinic around this time for final evaluation prior to return to work  Thresa EMERSON Sar, DPM [facility]  Dr. Thresa EMERSON Sar, DPM    [address] [place], [place] [address]                Office [phone]  Fax [phone]     Left

## 2023-10-22 ENCOUNTER — Encounter: Payer: Self-pay | Admitting: Podiatry

## 2023-11-18 ENCOUNTER — Ambulatory Visit (INDEPENDENT_AMBULATORY_CARE_PROVIDER_SITE_OTHER): Admitting: Podiatry

## 2023-11-18 ENCOUNTER — Encounter: Payer: Self-pay | Admitting: Podiatry

## 2023-11-18 VITALS — Ht 65.0 in | Wt 210.0 lb

## 2023-11-18 DIAGNOSIS — M2041 Other hammer toe(s) (acquired), right foot: Secondary | ICD-10-CM | POA: Diagnosis not present

## 2023-11-18 NOTE — Progress Notes (Signed)
   Chief Complaint  Patient presents with   Routine Post Op     POV DOS 07/25/23 -RT 2- 4 HAMMERTOE REPAIR, RT 2ND MET WEIL SHORTENING OSTEOTOMY, pt states everything is going well still has some occasional pain, no other complaints.    Subjective:  Patient presents today status post hammertoe correction digits 2, 3, 4 of the right foot as well as Weil shortening osteotomy to the second metatarsal of the right foot.  Patient has been trying to get back to normal activity and wearing closed toed shoes but she continues to have some rubbing to the toes and discomfort especially when she tries to wear her steel toed boots that she needs to wear at work  Past Medical History:  Diagnosis Date   Arrhythmia 05/26/09   ECHO ALL CHAMBERS  ARE NORMAL IN SIZE AND FUNCTION EF70%   Cervical spondylosis 12/20/2017   GERD (gastroesophageal reflux disease)    Hyperlipidemia    Hypertension    Obstructive sleep apnea    currently wearing CPAP   Seasonal allergies     Past Surgical History:  Procedure Laterality Date   ADENOIDECTOMY     BLADDER SUSPENSION     DEBRIDEMENT TENNIS ELBOW     ELBOW SURGERY     bilateral tendon surgery   PATELLA RECONSTRUCTION Left    TONSILLECTOMY      Allergies  Allergen Reactions   Shellfish Allergy Anaphylaxis    Objective/Physical Exam Unchanged.  Neurovascular status intact.  Incisions are nicely healed.  Toes are in good rectus alignment.  Good range of motion to the lesser MTPs.  There continues to be some residual edema to the digits which should resolve over time  Radiographic exam RT foot 10/21/2023  Stable with routine healing noted.  Overall the toes are in good alignment.  Assessment: 1. s/p hammertoe repair digits 2, 3, 4 right foot. DOS: 07/25/2023   Plan of Care:  -Patient was evaluated. - Continue to wear good supportive tennis shoes and sneakers.  Refrain from going barefoot.  Continue to slowly increase activity -RTW date to  12/03/2022 -Return to clinic PRN  Thresa EMERSON Sar, DPM [facility]  Dr. Thresa EMERSON Sar, DPM    [address] [place], [place] [address]                Office [phone]  Fax [phone]     Left

## 2023-11-27 ENCOUNTER — Encounter: Payer: Self-pay | Admitting: Podiatry

## 2023-12-02 ENCOUNTER — Encounter: Payer: Self-pay | Admitting: Podiatry

## 2023-12-02 NOTE — Telephone Encounter (Signed)
 Pt needs RTW note 12/04/23 no restrictions. I adv her via MyChart it will be in her email 2pm 12/02/23. Mess sent from Dr. Janit
